# Patient Record
Sex: Male | Born: 1955 | Race: White | Hispanic: No | Marital: Married | Smoking: Former smoker
Health system: Southern US, Community
[De-identification: ages and names within clinical notes are randomized; demographics above are authoritative.]

## PROBLEM LIST (undated history)

## (undated) DIAGNOSIS — M79604 Pain in right leg: Secondary | ICD-10-CM

## (undated) DIAGNOSIS — M797 Fibromyalgia: Secondary | ICD-10-CM

## (undated) DIAGNOSIS — Z973 Presence of spectacles and contact lenses: Secondary | ICD-10-CM

## (undated) DIAGNOSIS — I10 Essential (primary) hypertension: Secondary | ICD-10-CM

## (undated) DIAGNOSIS — K219 Gastro-esophageal reflux disease without esophagitis: Secondary | ICD-10-CM

## (undated) DIAGNOSIS — F32A Depression, unspecified: Secondary | ICD-10-CM

## (undated) DIAGNOSIS — M79605 Pain in left leg: Secondary | ICD-10-CM

## (undated) DIAGNOSIS — Z8619 Personal history of other infectious and parasitic diseases: Secondary | ICD-10-CM

## (undated) DIAGNOSIS — Z87442 Personal history of urinary calculi: Secondary | ICD-10-CM

## (undated) DIAGNOSIS — F329 Major depressive disorder, single episode, unspecified: Secondary | ICD-10-CM

## (undated) DIAGNOSIS — G4733 Obstructive sleep apnea (adult) (pediatric): Secondary | ICD-10-CM

## (undated) DIAGNOSIS — E785 Hyperlipidemia, unspecified: Secondary | ICD-10-CM

## (undated) DIAGNOSIS — Z9989 Dependence on other enabling machines and devices: Secondary | ICD-10-CM

## (undated) DIAGNOSIS — N200 Calculus of kidney: Secondary | ICD-10-CM

## (undated) HISTORY — PX: OTHER SURGICAL HISTORY: SHX169

## (undated) HISTORY — PX: EXTRACORPOREAL SHOCK WAVE LITHOTRIPSY: SHX1557

---
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
## 1999-06-24 HISTORY — PX: CHOLECYSTECTOMY: SHX55

## 2000-02-17 ENCOUNTER — Ambulatory Visit (HOSPITAL_COMMUNITY): Admission: RE | Admit: 2000-02-17 | Discharge: 2000-02-17 | Payer: Self-pay | Admitting: Urology

## 2000-02-17 HISTORY — PX: OTHER SURGICAL HISTORY: SHX169

## 2001-11-14 ENCOUNTER — Inpatient Hospital Stay (HOSPITAL_COMMUNITY): Admission: EM | Admit: 2001-11-14 | Discharge: 2001-11-15 | Payer: Self-pay | Admitting: Physical Therapy

## 2001-11-17 ENCOUNTER — Encounter: Admission: RE | Admit: 2001-11-17 | Discharge: 2001-11-17 | Payer: Self-pay | Admitting: Family Medicine

## 2008-06-02 ENCOUNTER — Ambulatory Visit (HOSPITAL_COMMUNITY): Admission: RE | Admit: 2008-06-02 | Discharge: 2008-06-03 | Payer: Self-pay | Admitting: Urology

## 2008-06-02 HISTORY — PX: PERCUTANEOUS NEPHROSTOLITHOTOMY: SHX2207

## 2009-10-16 ENCOUNTER — Encounter: Admission: RE | Admit: 2009-10-16 | Discharge: 2009-10-16 | Payer: Self-pay | Admitting: Unknown Physician Specialty

## 2010-06-04 ENCOUNTER — Encounter
Admission: RE | Admit: 2010-06-04 | Discharge: 2010-06-04 | Payer: Self-pay | Source: Home / Self Care | Attending: Unknown Physician Specialty | Admitting: Unknown Physician Specialty

## 2010-11-05 NOTE — Op Note (Signed)
NAMEWILDON, CUEVAS                ACCOUNT NO.:  [id number]   MEDICAL RECORD NO.:  [id number]          PATIENT TYPE:  AMB   LOCATION:  DAY                          FACILITY:  [facility]   PHYSICIAN:  Bertram Millard. Dahlstedt, M.D.DATE OF BIRTH:  1955/09/02   DATE OF PROCEDURE:  06/02/2008  DATE OF DISCHARGE:                               OPERATIVE REPORT   ATTENDING:  Bertram Millard. Dahlstedt, M.D.   ASSISTANT:  Noemi Chapel   PREOPERATIVE DIAGNOSIS:  Right renal and ureteral calculi.   POSTOPERATIVE DIAGNOSIS:  Right renal and ureteral calculi.   INDICATIONS:  This is a 55 year old gentleman with a long-standing  history of bilateral stones.  He had a long history of right-sided renal  and ureteral calculus with associated hydroureteronephrosis and is here  today electively for management of the stones.  Risks and benefits as  well as well as the alternatives were discussed with the patient  preoperatively.   PROCEDURES:  1. Balloon dilation of percutaneously placed nephrostomy tract.  2. Antegrade pyelogram.  3. Percutaneous nephrolithotomy with laser stone manipulation and      basket stone extraction, total stone volume larger than 2 cm.   PROCEDURE IN DETAIL:  The patient was brought back to the operating room  and after the successful induction of general endotracheal anesthetic,  he received preoperative antibiotics, and after placing a Foley catheter  he was placed in the prone position.  A preoperative time-out was  performed and he received preoperative antibiotics and all pressure  points were padded appropriately.   He was prepped and draped in the usual sterile fashion and then we  accessed the nephrostomy tube that had been placed by the interventional  radiologist on the right side.  This was cannulated with a guidewire and  the guidewire tip was seen in the bladder fluoroscopically.  At this  point the nephrostomy tube was removed and over this a dilator sheath  was  placed to the level of the UPJ.  A second guidewire was placed into  the bladder as seen fluoroscopically and at this point the access sheath  was removed.  The balloon dilator was then inserted over a working wire  and the safety wire was secured.  After incising the skin to allow for  the opening of the balloon dilator, we opened the balloon and dilated to  18 atmospheres of pressure.  With adequate dilation in place, we placed  our access sheath through the skin into the collecting system.  We used  the rigid renoscope to evaluate tract position and it was adequate.  We  looked around the renal pelvis and identified some stones that were  grasped out.  Clot was removed too.   At this point the flexible cystoscope was used to navigate the patient's  pelvis.  We were able to identify more stones at the UPJ that were  basketed and brought out.  Further, we completed an antegrade pyelogram  that showed a dilated system and stones in the lower pole as well as  stones in the mid to distal ureter.  At this  point with the cystoscope  looking down the UPJ, a second working wire was placed, and over this a  flexible ureteroscope was advanced to do ureteroscopy.   The flexible ureteroscope was then advanced into the bladder.  We slowly  pulled it back toward the kidney, examining the ureter which appeared  normal until approximately at the pelvic brim at which point there were  2 large stone fragments.  These were grasped on access and brought  through the proximal ureter, UPJ, kidney, and brought out of the  patient.  We then continued examining the ureter which appeared normal  up to the UPJ and there were some stone fragments seen and they were  also removed.   We switched then back out to the flexible cystoscope, identified a stone  in the lower pole of the kidney, and lasered it into small pieces.  We  were then able to basket the stones with some difficulty.  An  approximately 5 to 6-mm  stone fragment was left in the lower pole calix  as it could not be accessed with the flexible cystoscope.  At this point  we reexamined the rest of the collecting system and identified no  further stones.  We placed a 20-French Foley catheter as a nephrostomy  tube and did another antegrade pyelogram to ensure adequate positioning  and to identify that there was no injury to the collecting system..  At  this point the tube was secured to the skin and the procedure was ended.  Please note Dr. Marcine Matar was the attending surgeon who was  present throughout the entirety of the case.   ESTIMATED BLOOD LOSS:  Minimal.   URINE OUTPUT:  Unrecorded.   DRAINS:  1. Foley catheter.  2. 20-French nephrostomy tube.   SPECIMENS:  Stone for analysis.   DISPOSITION:  The patient will go to the PACU for further care.     ______________________________  Corie Chiquito. Dahlstedt, M.D.  Electronically Signed    Hadley Pen  D:  06/02/2008  T:  06/03/2008  Job:  [id number]

## 2010-11-08 NOTE — Op Note (Signed)
[facility]  Patient:    Edward [name], Edward [name]                       MRN: [id number] Proc. Date: 02/17/00 Adm. Date:  [id number] Attending:  Liborio Nixon                           Operative Report  PREOPERATIVE DIAGNOSIS:  Right distal ureteral stone, large.  POSTOPERATIVE DIAGNOSIS:  Right distal ureteral stone, large.  PROCEDURE:  Ureteroscopy, basket extraction of right ureteral calculus.  SURGEON:  Dr. Retta Diones.  ANESTHESIA:  General.  COMPLICATIONS:  None.  BRIEF HISTORY:  A 55 year old male with a long history of recurrent urolithiasis. We had been watching a right distal ureteral stone for quite a few weeks. The patient has had significant intermittent pain with this and at this point desires stone extraction. He is aware of the risks and complications of ureteroscopic stone extraction and desires to proceed. We are prepared to do holmium laser lithotripsy if necessary as this is a fair size stone.  DESCRIPTION OF PROCEDURE:  The patient was administered a general anesthetic and placed in the dorsal lithotomy position. He had been administered IV antibiotics preoperatively. He was prepped and draped in the usual manner. A 6 French micro ureteroscope was advanced directly through his urethra and up into this right ureter where approximately 3-4 cm up a large stone was identified, grasped with the basket and extracted without much difficulty. The ureteroscope was then passed back through the ureter, midway up the ureter no further stones were seen. In his prehospital visit to the office, no other ureteral calculi were identified. At this point, the bladder was drained with a cystoscope, and the procedure terminated. The patient does not desire to have a stent in, stents actually bother him more than kidney stones. The patient tolerated the procedure well, was extubated and taken to the PACU in stable condition. DD:  02/17/00 TD:   02/17/00 Job: [id number]

## 2010-11-08 NOTE — Discharge Summary (Signed)
[place]  Patient:    Edward [name], Edward [name] Visit Number: [id number] MRN: [id number]          Service Type: MED Location: [phone] Attending Physician:  McDiarmid, Leighton Roach. Dictated by:   Harrold Donath, M.D. Admit Date:  11/13/2001 Discharge Date: 11/15/2001   CC:         Gevena Barre  [facility] [place], [place]   Discharge Summary  PROCEDURES PERFORMED: An echocardiogram was done on Nov 15, 2001, which showed an ejection fraction of 55% to 65% and no wall abnormalities.  DISCHARGE DIAGNOSES 1. Chest palpitations, possible supraventricular tachycardia. 2. Hypertension.  DISCHARGE MEDICATIONS 1. Hydrochlorothiazide 12.5 mg p.o. q.d. 2. Metoprolol 12.5 mg p.o. b.i.d. 3. Wellbutrin SR 150 mg p.o. b.i.d. 4. Nexium 40 mg p.o. b.i.d. 5. Percocet 1 to 2 p.o. q.4-6h. p.r.n. 6. Xanax 0.25 mg p.o. b.i.d. p.r.n. 7. The patient was told to stop his Cozaar.  HISTORY OF PRESENT ILLNESS: The patient is a 55 year old white male with a history of hypertension and hypercholesterolemia who presented with a "racing heart" over the last day. This occurred a week ago prior to admission and his primary care physician placed him on Xanax for anxiety. The patient states this did not help and the night of admission he began to have some epigastric and lower sternal chest pain which radiated to his right shoulder accompanied by diaphoresis and dyspnea. He was admitted for rule out myocardial infarction and workup.  HOSPITAL COURSE  1. Palpitations and atypical chest pain. The patient was admitted for rule out MI. He was started on aspirin and morphine and nitroglycerin p.r.n. Of note he did receive relief with one sublingual nitroglycerin. With his risk factors for cardiac etiology and echocardiogram was ordered to evaluate for any wall abnormality or decreased cardiac output. The echocardiogram results are as above. The patient ruled  out by enzymes. His first set was a CK of 117, MB 0.8 and troponin 0.03. The remainder of his enzymes were similar to that. His EKG showed a normal sinus rhythm.  The patient had no further episodes while here in the hospital. This chest pain could have been anxiety or gastroesophageal reflux disease or supraventricular tachycardia. A loop monitor needs to be placed on the patient, but the offices were closed since it was a holiday and this will be arranged the day after discharge.  2. Hypertension. The patients blood pressure was elevated at 153/89. He was only taking Cozaar as an outpatient. He was placed on hydrochlorothiazide the day following admission. The night prior to discharge his blood pressure elevated to diastolics of 120. The patient was placed on a beta blocker which brought his pressures down. He was discharged on the beta blocker and hydrochlorothiazide. If his blood pressures are elevated at his followup appointment, the Cozaar can be added on.  3. Possible obstructive sleep apnea. The patient is somewhat obese and snores at night. A sleep study as an outpatient may be considered.  CONDITION ON DISCHARGE: The patient was discharged to home in stable condition.  DISCHARGE INSTRUCTIONS: The patient was told of his new medical regimen. He was also told that [facility] will be contacting him the day after discharge for a loop monitor.  FOLLOW UP:  He was told to make an appointment with his primary care physician in one to two weeks for follow up.   Dictated by:   Harrold Donath, M.D.  Attending Physician:  McDiarmid,  Todd D. DD:  11/15/01 TD:  11/17/01 Job: [id number]

## 2010-11-08 NOTE — H&P (Signed)
[place]. [facility]  Patient:    Edward [name], Edward [name] Visit Number: [id number] MRN: [id number]          Service Type: MED Location: [phone] Attending Physician:  McDiarmid, Leighton Roach. Dictated by:   Harrold Donath, M.D. Admit Date:  11/13/2001   CC:         Myra Rude, M.D., [facility], [place], [place]  [phone]                         History and Physical  CHIEF COMPLAINT:  Palpitations.  HISTORY OF PRESENT ILLNESS:  This is a 55 year old white male with a history of hypertension and hypercholesterolemia, who presents with a "racing heart" over the last day.  This occurred a week ago and his primary care physician placed him on Xanax which has not helped.  The palpitations occurred at rest and were somewhat relieved with ambulation.  Percocet did not help; he receives Percocets from a rheumatologist for chronic lower extremity pain with no concrete diagnosis.  He found some relief with nitroglycerin x1 in the emergency department.  Tonight, he began to have epigastric and lower sternal chest pain which radiated to his right shoulder accompanied by diaphoresis and dyspnea.  REVIEW OF SYSTEMS:  Negative for fevers.  Positive for headache.  Otherwise negative and as above.  PAST MEDICAL HISTORY:  Gastroesophageal reflux disease, hypertension, hypercholesterolemia, chronic lower extremity pain.  He also had a stress test done in 1997 which was negative.  MEDICATIONS: 1. Nexium 40 mg b.i.d. 2. Cozaar 50 mg q.d. 3. Percocet q.4h. p.r.n. 4. Wellbutrin 150 mg SR b.i.d., but the patient takes it q.d. 5. Xanax 0.25 mg t.i.d. p.r.n.  ALLERGIES:  PENICILLIN gives him a rash.  SOCIAL HISTORY:  History of tobacco use for approximately 30 years.  No alcohol.  No street drugs.  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  FAMILY HISTORY:  His father has atrial fibrillation and a pacemaker.  His mother has  diabetes.   PHYSICAL EXAMINATION:  VITAL SIGNS:  Temperature 97.5, blood pressure 153/89, respirations 20, heart rate 86, saturating 98% on room air.  GENERAL:  He is alert but in no acute distress but he appears slightly anxious and stutters.  HEENT:  Pupils are equal, round and reactive to light.  Extraocular muscles are intact.  Oropharynx is clear.  NECK:  No lymphadenopathy.  No thyromegaly.  No JVD.  No bruits.  LUNGS:  Lungs were clear to auscultation bilaterally.  CARDIOVASCULAR:  Regular rate and rhythm.  No murmurs, rubs, or gallops. Dorsalis pedis pulses 2+ bilaterally.  ABDOMEN:  Positive bowel sounds.  Soft, nontender, nondistended.  No hepatosplenomegaly.  RECTAL:  Guaiac negative.  Normal tone.  No masses.  EXTREMITIES:  No clubbing, cyanosis, or edema.  LABORATORY AND ACCESSORY DATA:  White blood cell count 8.9, H&H 13.6 and 39.9, platelets 331,000; there were 69% PNMs and 21% lymphocytes.  Sodium 139, potassium 3.6, chloride 105, bicarb 31, BUN 11, creatinine 1, glucose 117, calcium 9.1, alkaline phosphatase 86, albumin 3.9; AST 27, ALT 25, bilirubin 0.7, total protein 6.9.  CK 117, MB 0.8, troponin 0.03, PT 12.7 with an INR of 0.9, PTT 30.  EKG showed normal sinus rhythm.  Chest x-ray was consistent with bibasilar atelectasis versus a possible slight pulmonary edema.  ASSESSMENT AND PLAN: 1. This is a 55 year old with palpitations and atypical chest pain.  The  etiology could be anxiety, gastroesophageal reflux disease, gastritis,    acute myocardial infarction.  Cardiac etiology is less likely with the    atypical presentation.  Will admit to telemetry for rule out, start    aspirin, morphine, nitroglycerin p.r.n. and oxygen.  The only concern with    this presentation is relief with nitroglycerin but gastroesophageal reflux    disease can also do this as well.  His risk factors include a history of    tobacco, his gender, hypercholesterolemia and  hypertension.  We will also    continue Nexium with the increased b.i.d. dose. 2. Chronic pain.  The patient was seen by a rheumatologist who was unsure of    his diagnosis and placed him on Percocet.  I feel the patient has an    increased potential of dependence and possible pain medication abuse.  We    will place on morphine and equal analgesic dose and continue his Xanax. 3. Hypertension.  Continue Cozaar.  Continue hydrochlorothiazide if his blood    pressure remains elevated. Dictated by:   Harrold Donath, M.D. Attending Physician:  McDiarmid, Tawanna Cooler D. DD:  11/14/01 TD:  11/15/01 Job: [id number]

## 2011-03-27 LAB — COMPREHENSIVE METABOLIC PANEL
AST: 20 U/L (ref 0–37)
Albumin: 3.9 g/dL (ref 3.5–5.2)
Alkaline Phosphatase: 80 U/L (ref 39–117)
Chloride: 104 mEq/L (ref 96–112)
GFR calc Af Amer: >60 mL/min (ref >60)
Potassium: 3.7 mEq/L (ref 3.5–5.1)
Total Bilirubin: 0.7 mg/dL (ref 0.3–1.2)

## 2011-03-27 LAB — BASIC METABOLIC PANEL
BUN: 10 mg/dL (ref 6–23)
Calcium: 8.7 mg/dL (ref 8.4–10.5)
GFR calc non Af Amer: 58 mL/min — ABNORMAL LOW (ref >60)
Glucose, Bld: 127 mg/dL — ABNORMAL HIGH (ref 70–99)

## 2011-03-27 LAB — CBC
HCT: 35.3 % — ABNORMAL LOW (ref 39.0–52.0)
Platelets: 226 10*3/uL (ref 150–400)
Platelets: 256 10*3/uL (ref 150–400)
RDW: 13.4 % (ref 11.5–15.5)
WBC: 6.1 10*3/uL (ref 4.0–10.5)

## 2011-05-20 ENCOUNTER — Other Ambulatory Visit (HOSPITAL_COMMUNITY): Payer: Self-pay | Admitting: Neurosurgery

## 2011-05-20 DIAGNOSIS — M542 Cervicalgia: Secondary | ICD-10-CM

## 2011-05-20 DIAGNOSIS — M545 Low back pain: Secondary | ICD-10-CM

## 2011-05-29 ENCOUNTER — Inpatient Hospital Stay (HOSPITAL_COMMUNITY): Admission: RE | Admit: 2011-05-29 | Payer: Self-pay | Source: Ambulatory Visit

## 2013-06-29 ENCOUNTER — Other Ambulatory Visit: Payer: Self-pay | Admitting: Urology

## 2013-07-15 ENCOUNTER — Encounter (HOSPITAL_COMMUNITY): Payer: Self-pay | Admitting: Pharmacy Technician

## 2013-07-18 ENCOUNTER — Encounter (INDEPENDENT_AMBULATORY_CARE_PROVIDER_SITE_OTHER): Payer: Self-pay

## 2013-07-18 ENCOUNTER — Encounter (HOSPITAL_COMMUNITY): Payer: Self-pay

## 2013-07-18 ENCOUNTER — Encounter (HOSPITAL_COMMUNITY)
Admission: RE | Admit: 2013-07-18 | Discharge: 2013-07-18 | Disposition: A | Discharge status: Home / Self Care | Payer: BC Managed Care – PPO | Source: Ambulatory Visit | Attending: Urology | Admitting: Urology

## 2013-07-18 DIAGNOSIS — Z01812 Encounter for preprocedural laboratory examination: Secondary | ICD-10-CM | POA: Insufficient documentation

## 2013-07-18 DIAGNOSIS — Z01810 Encounter for preprocedural cardiovascular examination: Secondary | ICD-10-CM | POA: Insufficient documentation

## 2013-07-18 DIAGNOSIS — Z01818 Encounter for other preprocedural examination: Secondary | ICD-10-CM | POA: Insufficient documentation

## 2013-07-18 HISTORY — DX: Pain in right leg: M79.604

## 2013-07-18 HISTORY — DX: Major depressive disorder, single episode, unspecified: F32.9

## 2013-07-18 HISTORY — DX: Essential (primary) hypertension: I10

## 2013-07-18 HISTORY — DX: Pain in right leg: M79.605

## 2013-07-18 HISTORY — DX: Fibromyalgia: M79.7

## 2013-07-18 HISTORY — DX: Gastro-esophageal reflux disease without esophagitis: K21.9

## 2013-07-18 HISTORY — DX: Hyperlipidemia, unspecified: E78.5

## 2013-07-18 HISTORY — DX: Personal history of urinary calculi: Z87.442

## 2013-07-18 HISTORY — DX: Depression, unspecified: F32.A

## 2013-07-18 LAB — BASIC METABOLIC PANEL
BUN: 10 mg/dL (ref 6–23)
CALCIUM: 9.6 mg/dL (ref 8.4–10.5)
CO2: 29 meq/L (ref 19–32)
Chloride: 99 mEq/L (ref 96–112)
Creatinine, Ser: 1.18 mg/dL (ref 0.50–1.35)
GFR calc Af Amer: 77 mL/min — ABNORMAL LOW (ref >90)
GFR, EST NON AFRICAN AMERICAN: 67 mL/min — AB (ref >90)
GLUCOSE: 121 mg/dL — AB (ref 70–99)
POTASSIUM: 3.8 meq/L (ref 3.7–5.3)
SODIUM: 138 meq/L (ref 137–147)

## 2013-07-18 LAB — CBC
HCT: 42.4 % (ref 39.0–52.0)
Hemoglobin: 14.2 g/dL (ref 13.0–17.0)
MCH: 30.2 pg (ref 26.0–34.0)
MCHC: 33.5 g/dL (ref 30.0–36.0)
MCV: 90.2 fL (ref 78.0–100.0)
PLATELETS: 243 10*3/uL (ref 150–400)
RBC: 4.7 MIL/uL (ref 4.22–5.81)
RDW: 14.5 % (ref 11.5–15.5)
WBC: 7.7 10*3/uL (ref 4.0–10.5)

## 2013-07-18 LAB — ABO/RH: ABO/RH(D): A POS

## 2013-07-18 NOTE — Patient Instructions (Addendum)
Marcie MowersKevan E Muscat  07/18/2013                           YOUR PROCEDURE IS SCHEDULED ON: 07/22/13               PLEASE REPORT TO SHORT STAY CENTER AT : 7:45 am               CALL THIS NUMBER IF ANY PROBLEMS THE DAY OF SURGERY :               [phone]                      REMEMBER:   Do not eat food or drink liquids AFTER MIDNIGHT   Take these medicines the morning of surgery with A SIP OF WATER: citalopram / prilosec / oxycodone   Do not wear jewelry, make-up   Do not wear lotions, powders, or perfumes.   Do not shave legs or underarms 12 hrs. before surgery (men may shave face)  Do not bring valuables to the hospital.  Contacts, dentures or bridgework may not be worn into surgery.  Leave suitcase in the car. After surgery it may be brought to your room.  For patients admitted to the hospital more than one night, checkout time is 11:00                          The day of discharge.   Patients discharged the day of surgery will not be allowed to drive home                             If going home same day of surgery, must have someone stay with you first                           24 hrs at home and arrange for some one to drive you home from hospital.    Special Instructions:   Please read over the following fact sheets that you were given:             1. [place] PREPARING FOR SURGERY SHEET             2. BRING C PAP MASK AND TUBING TO HOSPITAL                                                X_____________________________________________________________________        Failure to follow these instructions may result in cancellation of your surgery

## 2013-07-19 LAB — URINE CULTURE
Colony Count: NO GROWTH
Culture: NO GROWTH

## 2013-07-22 ENCOUNTER — Ambulatory Visit (HOSPITAL_COMMUNITY): Payer: BC Managed Care – PPO

## 2013-07-22 ENCOUNTER — Encounter (HOSPITAL_COMMUNITY): Payer: Self-pay | Admitting: *Deleted

## 2013-07-22 ENCOUNTER — Ambulatory Visit (HOSPITAL_COMMUNITY): Payer: BC Managed Care – PPO | Admitting: Certified Registered Nurse Anesthetist

## 2013-07-22 ENCOUNTER — Encounter (HOSPITAL_COMMUNITY): Payer: BC Managed Care – PPO | Admitting: Certified Registered Nurse Anesthetist

## 2013-07-22 ENCOUNTER — Ambulatory Visit (HOSPITAL_COMMUNITY)
Admission: RE | Admit: 2013-07-22 | Discharge: 2013-07-23 | Disposition: A | Discharge status: Home / Self Care | Payer: BC Managed Care – PPO | Source: Ambulatory Visit | Attending: Urology | Admitting: Urology

## 2013-07-22 ENCOUNTER — Encounter (HOSPITAL_COMMUNITY)
Admission: RE | Disposition: A | Discharge status: Home / Self Care | Payer: Self-pay | Source: Ambulatory Visit | Attending: Urology

## 2013-07-22 DIAGNOSIS — Z79899 Other long term (current) drug therapy: Secondary | ICD-10-CM | POA: Diagnosis not present

## 2013-07-22 DIAGNOSIS — N1330 Unspecified hydronephrosis: Secondary | ICD-10-CM | POA: Insufficient documentation

## 2013-07-22 DIAGNOSIS — I10 Essential (primary) hypertension: Secondary | ICD-10-CM | POA: Diagnosis not present

## 2013-07-22 DIAGNOSIS — Z87891 Personal history of nicotine dependence: Secondary | ICD-10-CM | POA: Diagnosis not present

## 2013-07-22 DIAGNOSIS — Z8619 Personal history of other infectious and parasitic diseases: Secondary | ICD-10-CM | POA: Diagnosis not present

## 2013-07-22 DIAGNOSIS — N201 Calculus of ureter: Secondary | ICD-10-CM | POA: Diagnosis present

## 2013-07-22 DIAGNOSIS — G4730 Sleep apnea, unspecified: Secondary | ICD-10-CM | POA: Insufficient documentation

## 2013-07-22 DIAGNOSIS — E7800 Pure hypercholesterolemia, unspecified: Secondary | ICD-10-CM | POA: Diagnosis not present

## 2013-07-22 DIAGNOSIS — J449 Chronic obstructive pulmonary disease, unspecified: Secondary | ICD-10-CM | POA: Diagnosis not present

## 2013-07-22 DIAGNOSIS — Z9089 Acquired absence of other organs: Secondary | ICD-10-CM | POA: Insufficient documentation

## 2013-07-22 DIAGNOSIS — IMO0001 Reserved for inherently not codable concepts without codable children: Secondary | ICD-10-CM | POA: Insufficient documentation

## 2013-07-22 DIAGNOSIS — J4489 Other specified chronic obstructive pulmonary disease: Secondary | ICD-10-CM | POA: Insufficient documentation

## 2013-07-22 DIAGNOSIS — N200 Calculus of kidney: Secondary | ICD-10-CM | POA: Diagnosis not present

## 2013-07-22 DIAGNOSIS — K219 Gastro-esophageal reflux disease without esophagitis: Secondary | ICD-10-CM | POA: Insufficient documentation

## 2013-07-22 DIAGNOSIS — R918 Other nonspecific abnormal finding of lung field: Secondary | ICD-10-CM | POA: Diagnosis not present

## 2013-07-22 DIAGNOSIS — M79609 Pain in unspecified limb: Secondary | ICD-10-CM | POA: Insufficient documentation

## 2013-07-22 HISTORY — PX: CYSTOSCOPY WITH RETROGRADE PYELOGRAM, URETEROSCOPY AND STENT PLACEMENT: SHX5789

## 2013-07-22 HISTORY — PX: HOLMIUM LASER APPLICATION: SHX5852

## 2013-07-22 HISTORY — PX: NEPHROLITHOTOMY: SHX5134

## 2013-07-22 LAB — TYPE AND SCREEN
ABO/RH(D): A POS
Antibody Screen: NEGATIVE

## 2013-07-22 LAB — CBC
HCT: 39.3 % (ref 39.0–52.0)
HEMOGLOBIN: 13.1 g/dL (ref 13.0–17.0)
MCH: 30.1 pg (ref 26.0–34.0)
MCHC: 33.3 g/dL (ref 30.0–36.0)
MCV: 90.3 fL (ref 78.0–100.0)
Platelets: 201 10*3/uL (ref 150–400)
RBC: 4.35 MIL/uL (ref 4.22–5.81)
RDW: 14.8 % (ref 11.5–15.5)
WBC: 10.1 10*3/uL (ref 4.0–10.5)

## 2013-07-22 LAB — BASIC METABOLIC PANEL
BUN: 15 mg/dL (ref 6–23)
CO2: 25 meq/L (ref 19–32)
Calcium: 8.9 mg/dL (ref 8.4–10.5)
Chloride: 99 mEq/L (ref 96–112)
Creatinine, Ser: 1.41 mg/dL — ABNORMAL HIGH (ref 0.50–1.35)
GFR calc Af Amer: 62 mL/min — ABNORMAL LOW (ref >90)
GFR calc non Af Amer: 54 mL/min — ABNORMAL LOW (ref >90)
Glucose, Bld: 167 mg/dL — ABNORMAL HIGH (ref 70–99)
Potassium: 4.6 mEq/L (ref 3.7–5.3)
SODIUM: 138 meq/L (ref 137–147)

## 2013-07-22 SURGERY — CYSTOURETEROSCOPY, WITH RETROGRADE PYELOGRAM AND STENT INSERTION
Anesthesia: General | Laterality: Right

## 2013-07-22 MED ORDER — BUPIVACAINE HCL 0.5 % IJ SOLN
INTRAMUSCULAR | Status: DC | PRN
  Administered 2013-07-22: 10 mL

## 2013-07-22 MED ORDER — LIDOCAINE HCL (CARDIAC) 20 MG/ML IV SOLN
INTRAVENOUS | Status: AC
  Filled 2013-07-22: qty 5

## 2013-07-22 MED ORDER — BUPIVACAINE HCL (PF) 0.5 % IJ SOLN
INTRAMUSCULAR | Status: AC
  Filled 2013-07-22: qty 30

## 2013-07-22 MED ORDER — CEFAZOLIN SODIUM-DEXTROSE 2-3 GM-% IV SOLR
INTRAVENOUS | Status: AC
  Filled 2013-07-22: qty 50

## 2013-07-22 MED ORDER — ROCURONIUM BROMIDE 100 MG/10ML IV SOLN
INTRAVENOUS | Status: AC
  Filled 2013-07-22: qty 1

## 2013-07-22 MED ORDER — ROCURONIUM BROMIDE 100 MG/10ML IV SOLN
INTRAVENOUS | Status: DC | PRN
  Administered 2013-07-22 (×2): 20 mg via INTRAVENOUS
  Administered 2013-07-22: 10 mg via INTRAVENOUS
  Administered 2013-07-22: 50 mg via INTRAVENOUS
  Administered 2013-07-22: 10 mg via INTRAVENOUS

## 2013-07-22 MED ORDER — NEOSTIGMINE METHYLSULFATE 1 MG/ML IJ SOLN
INTRAMUSCULAR | Status: AC
  Filled 2013-07-22: qty 10

## 2013-07-22 MED ORDER — LACTATED RINGERS IV SOLN
INTRAVENOUS | Status: DC
  Administered 2013-07-22 (×2): via INTRAVENOUS

## 2013-07-22 MED ORDER — PROPOFOL 10 MG/ML IV BOLUS
INTRAVENOUS | Status: AC
  Filled 2013-07-22: qty 20

## 2013-07-22 MED ORDER — NEOSTIGMINE METHYLSULFATE 1 MG/ML IJ SOLN
INTRAMUSCULAR | Status: DC | PRN
  Administered 2013-07-22: 5 mg via INTRAVENOUS

## 2013-07-22 MED ORDER — FENTANYL CITRATE 0.05 MG/ML IJ SOLN
INTRAMUSCULAR | Status: AC
  Filled 2013-07-22: qty 2

## 2013-07-22 MED ORDER — ONDANSETRON HCL 4 MG/2ML IJ SOLN
INTRAMUSCULAR | Status: DC | PRN
Start: 2013-07-22 — End: 2013-07-22
  Administered 2013-07-22: 4 mg via INTRAVENOUS

## 2013-07-22 MED ORDER — HYDROMORPHONE HCL PF 1 MG/ML IJ SOLN
INTRAMUSCULAR | Status: DC | PRN
  Administered 2013-07-22 (×2): 0.5 mg via INTRAVENOUS

## 2013-07-22 MED ORDER — LIDOCAINE-EPINEPHRINE (PF) 1 %-1:200000 IJ SOLN
INTRAMUSCULAR | Status: DC | PRN
  Administered 2013-07-22: 10 mL

## 2013-07-22 MED ORDER — PROPOFOL 10 MG/ML IV BOLUS
INTRAVENOUS | Status: DC | PRN
  Administered 2013-07-22: 200 mg via INTRAVENOUS

## 2013-07-22 MED ORDER — POLYETHYLENE GLYCOL 3350 17 GM/SCOOP PO POWD: 1.0000 | Freq: Every day | ORAL | Status: DC

## 2013-07-22 MED ORDER — MORPHINE SULFATE 4 MG/ML IJ SOLN
4.0000 mg | INTRAMUSCULAR | Status: DC | PRN
Start: 2013-07-22 — End: 2013-07-23

## 2013-07-22 MED ORDER — IOHEXOL 300 MG/ML  SOLN
INTRAMUSCULAR | Status: DC | PRN
  Administered 2013-07-22: 200 mL

## 2013-07-22 MED ORDER — GLYCOPYRROLATE 0.2 MG/ML IJ SOLN
INTRAMUSCULAR | Status: DC | PRN
  Administered 2013-07-22: .8 mg via INTRAVENOUS

## 2013-07-22 MED ORDER — LORATADINE 10 MG PO TABS
10.0000 mg | ORAL_TABLET | Freq: Every day | ORAL | Status: DC
  Administered 2013-07-23: 10 mg via ORAL
  Filled 2013-07-22: qty 1

## 2013-07-22 MED ORDER — MIDAZOLAM HCL 5 MG/5ML IJ SOLN
INTRAMUSCULAR | Status: DC | PRN
  Administered 2013-07-22: 2 mg via INTRAVENOUS

## 2013-07-22 MED ORDER — BACITRACIN-NEOMYCIN-POLYMYXIN 400-5-5000 EX OINT: 1.0000 "application " | TOPICAL_OINTMENT | Freq: Three times a day (TID) | CUTANEOUS | Status: DC | PRN

## 2013-07-22 MED ORDER — ZOLPIDEM TARTRATE 10 MG PO TABS
10.0000 mg | ORAL_TABLET | Freq: Every evening | ORAL | Status: DC | PRN
  Administered 2013-07-22: 10 mg via ORAL
  Filled 2013-07-22: qty 1

## 2013-07-22 MED ORDER — CITALOPRAM HYDROBROMIDE 40 MG PO TABS
40.0000 mg | ORAL_TABLET | Freq: Every morning | ORAL | Status: DC
  Administered 2013-07-23: 40 mg via ORAL
  Filled 2013-07-22: qty 1

## 2013-07-22 MED ORDER — LIDOCAINE-EPINEPHRINE 1 %-1:100000 IJ SOLN
INTRAMUSCULAR | Status: AC
  Filled 2013-07-22: qty 1

## 2013-07-22 MED ORDER — AMITRIPTYLINE HCL 50 MG PO TABS
50.0000 mg | ORAL_TABLET | Freq: Every day | ORAL | Status: DC
  Administered 2013-07-22: 75 mg via ORAL
  Filled 2013-07-22 (×2): qty 1.5

## 2013-07-22 MED ORDER — ONDANSETRON HCL 4 MG/2ML IJ SOLN
4.0000 mg | INTRAMUSCULAR | Status: DC | PRN
  Administered 2013-07-22: 4 mg via INTRAVENOUS
  Filled 2013-07-22: qty 2

## 2013-07-22 MED ORDER — PHENYLEPHRINE HCL 10 MG/ML IJ SOLN
10.0000 mg | INTRAVENOUS | Status: DC | PRN
  Administered 2013-07-22: 50 ug/min via INTRAVENOUS

## 2013-07-22 MED ORDER — CIPROFLOXACIN IN D5W 400 MG/200ML IV SOLN
400.0000 mg | INTRAVENOUS | Status: AC
  Administered 2013-07-22: 400 mg via INTRAVENOUS

## 2013-07-22 MED ORDER — BELLADONNA ALKALOIDS-OPIUM 16.2-60 MG RE SUPP: 1.0000 | Freq: Four times a day (QID) | RECTAL | Status: DC | PRN

## 2013-07-22 MED ORDER — MIDAZOLAM HCL 2 MG/2ML IJ SOLN
INTRAMUSCULAR | Status: AC
  Filled 2013-07-22: qty 2

## 2013-07-22 MED ORDER — DEXAMETHASONE SODIUM PHOSPHATE 10 MG/ML IJ SOLN
INTRAMUSCULAR | Status: DC | PRN
  Administered 2013-07-22: 10 mg via INTRAVENOUS

## 2013-07-22 MED ORDER — SODIUM CHLORIDE 0.45 % IV SOLN
INTRAVENOUS | Status: DC
  Administered 2013-07-22 – 2013-07-23 (×2): via INTRAVENOUS

## 2013-07-22 MED ORDER — SUCCINYLCHOLINE CHLORIDE 20 MG/ML IJ SOLN
INTRAMUSCULAR | Status: AC
  Filled 2013-07-22: qty 1

## 2013-07-22 MED ORDER — PROMETHAZINE HCL 25 MG/ML IJ SOLN: 6.2500 mg | INTRAMUSCULAR | Status: DC | PRN

## 2013-07-22 MED ORDER — MENTHOL 3 MG MT LOZG: 1.0000 | LOZENGE | OROMUCOSAL | Status: DC | PRN

## 2013-07-22 MED ORDER — ACETAMINOPHEN 10 MG/ML IV SOLN
1000.0000 mg | Freq: Four times a day (QID) | INTRAVENOUS | Status: DC
  Administered 2013-07-22 – 2013-07-23 (×2): 1000 mg via INTRAVENOUS
  Filled 2013-07-22 (×4): qty 100

## 2013-07-22 MED ORDER — LIDOCAINE HCL (CARDIAC) 20 MG/ML IV SOLN
INTRAVENOUS | Status: DC | PRN
  Administered 2013-07-22: 100 mg via INTRAVENOUS

## 2013-07-22 MED ORDER — CEFAZOLIN SODIUM-DEXTROSE 2-3 GM-% IV SOLR
2.0000 g | INTRAVENOUS | Status: AC
  Administered 2013-07-22: 2 g via INTRAVENOUS

## 2013-07-22 MED ORDER — PHENYLEPHRINE HCL 10 MG/ML IJ SOLN
INTRAMUSCULAR | Status: AC
  Filled 2013-07-22: qty 1

## 2013-07-22 MED ORDER — OXYCODONE HCL 5 MG PO TABS
15.0000 mg | ORAL_TABLET | ORAL | Status: DC | PRN
  Administered 2013-07-22 – 2013-07-23 (×5): 15 mg via ORAL
  Filled 2013-07-22 (×5): qty 3

## 2013-07-22 MED ORDER — CEFAZOLIN SODIUM-DEXTROSE 2-3 GM-% IV SOLR
2.0000 g | Freq: Once | INTRAVENOUS | Status: AC
  Administered 2013-07-22: 2 g via INTRAVENOUS

## 2013-07-22 MED ORDER — HYDROMORPHONE HCL PF 2 MG/ML IJ SOLN
INTRAMUSCULAR | Status: AC
  Filled 2013-07-22: qty 1

## 2013-07-22 MED ORDER — ONDANSETRON HCL 4 MG PO TABS: 4.0000 mg | ORAL_TABLET | Freq: Three times a day (TID) | ORAL | Status: DC | PRN

## 2013-07-22 MED ORDER — POLYETHYLENE GLYCOL 3350 17 G PO PACK
17.0000 g | PACK | Freq: Every day | ORAL | Status: DC
  Administered 2013-07-22: 17 g via ORAL
  Filled 2013-07-22 (×2): qty 1

## 2013-07-22 MED ORDER — HYDROMORPHONE HCL PF 1 MG/ML IJ SOLN: 0.2500 mg | INTRAMUSCULAR | Status: DC | PRN

## 2013-07-22 MED ORDER — DEXAMETHASONE SODIUM PHOSPHATE 10 MG/ML IJ SOLN
INTRAMUSCULAR | Status: AC
  Filled 2013-07-22: qty 1

## 2013-07-22 MED ORDER — CIPROFLOXACIN IN D5W 400 MG/200ML IV SOLN
INTRAVENOUS | Status: AC
  Filled 2013-07-22: qty 200

## 2013-07-22 MED ORDER — METOPROLOL SUCCINATE ER 50 MG PO TB24
50.0000 mg | ORAL_TABLET | Freq: Every day | ORAL | Status: DC
  Administered 2013-07-22: 50 mg via ORAL
  Filled 2013-07-22 (×2): qty 1

## 2013-07-22 MED ORDER — FENTANYL CITRATE 0.05 MG/ML IJ SOLN
INTRAMUSCULAR | Status: DC | PRN
  Administered 2013-07-22 (×5): 50 ug via INTRAVENOUS
  Administered 2013-07-22: 100 ug via INTRAVENOUS

## 2013-07-22 MED ORDER — SODIUM CHLORIDE 0.9 % IR SOLN
Status: DC | PRN
  Administered 2013-07-22: 20000 mL via INTRAVESICAL

## 2013-07-22 MED ORDER — SUCCINYLCHOLINE CHLORIDE 20 MG/ML IJ SOLN
INTRAMUSCULAR | Status: DC | PRN
  Administered 2013-07-22: 100 mg via INTRAVENOUS

## 2013-07-22 MED ORDER — FENTANYL CITRATE 0.05 MG/ML IJ SOLN
INTRAMUSCULAR | Status: AC
  Filled 2013-07-22: qty 5

## 2013-07-22 MED ORDER — BISACODYL 10 MG RE SUPP: 10.0000 mg | Freq: Every day | RECTAL | Status: DC | PRN

## 2013-07-22 MED ORDER — ONDANSETRON HCL 4 MG/2ML IJ SOLN
INTRAMUSCULAR | Status: AC
  Filled 2013-07-22: qty 2

## 2013-07-22 MED ORDER — SIMVASTATIN 40 MG PO TABS
40.0000 mg | ORAL_TABLET | Freq: Every day | ORAL | Status: DC
  Administered 2013-07-22: 40 mg via ORAL
  Filled 2013-07-22 (×2): qty 1

## 2013-07-22 MED ORDER — PANTOPRAZOLE SODIUM 40 MG PO TBEC
40.0000 mg | DELAYED_RELEASE_TABLET | Freq: Every day | ORAL | Status: DC
  Administered 2013-07-22 – 2013-07-23 (×2): 40 mg via ORAL
  Filled 2013-07-22 (×2): qty 1

## 2013-07-22 MED ORDER — GLYCOPYRROLATE 0.2 MG/ML IJ SOLN
INTRAMUSCULAR | Status: AC
  Filled 2013-07-22: qty 3

## 2013-07-22 MED ORDER — PHENOL 1.4 % MT LIQD: 1.0000 | OROMUCOSAL | Status: DC | PRN

## 2013-07-22 SURGICAL SUPPLY — 68 items
BAG URINE DRAINAGE (UROLOGICAL SUPPLIES) ×6 IMPLANT
BAG URO CATCHER STRL LF (DRAPE) ×3 IMPLANT
BASKET ZERO TIP NITINOL 2.4FR (BASKET) ×6 IMPLANT
BENZOIN TINCTURE PRP APPL 2/3 (GAUZE/BANDAGES/DRESSINGS) IMPLANT
BLADE SURG 15 STRL LF DISP TIS (BLADE) ×2 IMPLANT
BLADE SURG 15 STRL SS (BLADE) ×1
CATH AINSWORTH 30CC 24FR (CATHETERS) IMPLANT
CATH BEACON 5.038 65CM KMP-01 (CATHETERS) ×3 IMPLANT
CATH CLEAR GEL 3F BACKSTOP (CATHETERS) ×3 IMPLANT
CATH COUNCIL 22FR (CATHETERS) ×3 IMPLANT
CATH FOLEY 2W COUNCIL 20FR 5CC (CATHETERS) IMPLANT
CATH FOLEY 2WAY SLVR  5CC 16FR (CATHETERS) ×1
CATH FOLEY 2WAY SLVR 5CC 16FR (CATHETERS) ×2 IMPLANT
CATH INTERMIT  6FR 70CM (CATHETERS) ×3 IMPLANT
CATH KUMPE (CATHETERS) ×1
CATH ROBINSON RED A/P 20FR (CATHETERS) IMPLANT
CATH SLIP 5FR 0.38 X 40 KMP (CATHETERS) ×2 IMPLANT
CATH URET 5FR 28IN CONE TIP (BALLOONS)
CATH URET 5FR 28IN OPEN ENDED (CATHETERS) ×6 IMPLANT
CATH URET 5FR 70CM CONE TIP (BALLOONS) IMPLANT
CATH URET DUAL LUMEN 6-10FR 50 (CATHETERS) ×3 IMPLANT
CATH X-FORCE N30 NEPHROSTOMY (TUBING) ×3 IMPLANT
CHLORAPREP W/TINT 26ML (MISCELLANEOUS) ×6 IMPLANT
CLOTH BEACON ORANGE TIMEOUT ST (SAFETY) ×3 IMPLANT
COVER SURGICAL LIGHT HANDLE (MISCELLANEOUS) ×3 IMPLANT
DERMABOND ADVANCED (GAUZE/BANDAGES/DRESSINGS) ×1
DERMABOND ADVANCED .7 DNX12 (GAUZE/BANDAGES/DRESSINGS) ×2 IMPLANT
DRAPE C-ARM 42X120 X-RAY (DRAPES) ×6 IMPLANT
DRAPE CAMERA CLOSED 9X96 (DRAPES) ×6 IMPLANT
DRAPE LG THREE QUARTER DISP (DRAPES) ×9 IMPLANT
DRAPE LINGEMAN PERC (DRAPES) IMPLANT
DRAPE SURG IRRIG POUCH 19X23 (DRAPES) ×3 IMPLANT
DRSG TEGADERM 2-3/8X2-3/4 SM (GAUZE/BANDAGES/DRESSINGS) ×6 IMPLANT
DRSG TEGADERM 8X12 (GAUZE/BANDAGES/DRESSINGS) ×3 IMPLANT
FIBER LASER FLEXIVA 365 (UROLOGICAL SUPPLIES) ×6 IMPLANT
FIBER LASER FLEXIVA 550 (UROLOGICAL SUPPLIES) IMPLANT
GLOVE BIOGEL M STRL SZ7.5 (GLOVE) ×9 IMPLANT
GOWN STRL REUS W/TWL XL LVL3 (GOWN DISPOSABLE) ×9 IMPLANT
GUIDEWIRE AMPLAZ .035X145 (WIRE) ×6 IMPLANT
GUIDEWIRE ANG ZIPWIRE 038X150 (WIRE) ×6 IMPLANT
GUIDEWIRE STR DUAL SENSOR (WIRE) ×12 IMPLANT
KIT BASIN OR (CUSTOM PROCEDURE TRAY) ×3 IMPLANT
LABEL STERILE EO BLANK 1X3 WHT (LABEL) ×3 IMPLANT
MANIFOLD NEPTUNE II (INSTRUMENTS) ×3 IMPLANT
NEEDLE HYPO 22GX1.5 SAFETY (NEEDLE) ×3 IMPLANT
NEEDLE TROCAR 18X15 ECHO (NEEDLE) ×3 IMPLANT
NEEDLE TROCAR 18X20 (NEEDLE) ×3 IMPLANT
NS IRRIG 1000ML POUR BTL (IV SOLUTION) IMPLANT
PACK BASIC VI WITH GOWN DISP (CUSTOM PROCEDURE TRAY) ×3 IMPLANT
PACK CYSTO (CUSTOM PROCEDURE TRAY) ×3 IMPLANT
PAD ABD 7.5X8 STRL (GAUZE/BANDAGES/DRESSINGS) IMPLANT
PROBE LITHOCLAST ULTRA 3.8X403 (UROLOGICAL SUPPLIES) IMPLANT
PROBE PNEUMATIC 1.0MMX570MM (UROLOGICAL SUPPLIES) IMPLANT
SET IRRIG Y TYPE TUR BLADDER L (SET/KITS/TRAYS/PACK) ×3 IMPLANT
SET WARMING FLUID IRRIGATION (MISCELLANEOUS) IMPLANT
SPONGE GAUZE 4X4 12PLY (GAUZE/BANDAGES/DRESSINGS) ×3 IMPLANT
SPONGE LAP 4X18 X RAY DECT (DISPOSABLE) ×6 IMPLANT
STENT CONTOUR 6FRX26X.038 (STENTS) ×6 IMPLANT
STONE CATCHER W/TUBE ADAPTER (UROLOGICAL SUPPLIES) ×6 IMPLANT
SUT SILK 0 FSL (SUTURE) ×3 IMPLANT
SYR 20CC LL (SYRINGE) ×6 IMPLANT
SYR CONTROL 10ML LL (SYRINGE) ×3 IMPLANT
SYRINGE 10CC LL (SYRINGE) ×6 IMPLANT
TOWEL OR 17X26 10 PK STRL BLUE (TOWEL DISPOSABLE) ×3 IMPLANT
TUBE FEEDING 8FR 16IN STR KANG (MISCELLANEOUS) ×3 IMPLANT
TUBING CONNECTING 10 (TUBING) ×9 IMPLANT
WATER STERILE IRR 1500ML POUR (IV SOLUTION) IMPLANT
WIRE COONS/BENSON .038X145CM (WIRE) IMPLANT

## 2013-07-22 NOTE — Progress Notes (Signed)
At pharmacy request verified with doctor patient to get both Cipro and Ancef [facility]-op info [facility]

## 2013-07-22 NOTE — Transfer of Care (Signed)
Immediate Anesthesia Transfer of Care Note  Patient: Marcie MowersKevan E Mitcham  Procedure(s) Performed: Procedure(s) (LRB): CYSTOSCOPY WITH LEFT AND RIGHT  URETEROSCOPY ,HOLMIUM LASER LITHTRIPSY,  STENT PLACEMENT, RIGHT AND LEFT  RETROGRAM PYELOGRAM  (Bilateral) RIGHT PERCUTANEOUS NEPHROLITHOTOMY SURGEON ACCESS  (Right) HOLMIUM LASER APPLICATION (Bilateral)  Patient Location: PACU  Anesthesia Type: General  Level of Consciousness: sedated, patient cooperative and responds to stimulation  Airway & Oxygen Therapy: Patient Spontanous Breathing and Patient connected to face mask oxgen  Post-op Assessment: Report given to PACU RN and Post -op Vital signs reviewed and stable  Post vital signs: Reviewed and stable  Complications: No apparent anesthesia complications

## 2013-07-22 NOTE — Preoperative (Signed)
Beta Blockers   Reason not to administer Beta Blockers:Not Applicable Patient took Beta Blocker 07-21-13 at 1700

## 2013-07-22 NOTE — Anesthesia Preprocedure Evaluation (Addendum)
Anesthesia Evaluation  Patient identified by MRN, date of birth, ID band Patient awake    Reviewed: Allergy & Precautions, H&P , NPO status , Patient's Chart, lab work & pertinent test results  Airway Mallampati: II TM Distance: <3 FB Neck ROM: Full    Dental no notable dental hx.    Pulmonary sleep apnea and Continuous Positive Airway Pressure Ventilation , COPDformer smoker,  breath sounds clear to auscultation  Pulmonary exam normal       Cardiovascular hypertension, Pt. on medications and Pt. on home beta blockers Rhythm:Regular Rate:Normal     Neuro/Psych negative neurological ROS  negative psych ROS   GI/Hepatic Neg liver ROS, GERD-  Medicated,  Endo/Other  negative endocrine ROS  Renal/GU negative Renal ROS  negative genitourinary   Musculoskeletal negative musculoskeletal ROS (+)   Abdominal   Peds negative pediatric ROS (+)  Hematology negative hematology ROS (+)   Anesthesia Other Findings   Reproductive/Obstetrics negative OB ROS                        Anesthesia Physical Anesthesia Plan  ASA: III  Anesthesia Plan: General   Post-op Pain Management:    Induction: Intravenous  Airway Management Planned: Oral ETT  Additional Equipment:   Intra-op Plan:   Post-operative Plan: Extubation in OR  Informed Consent: I have reviewed the patients History and Physical, chart, labs and discussed the procedure including the risks, benefits and alternatives for the proposed anesthesia with the patient or authorized representative who has indicated his/her understanding and acceptance.   Dental advisory given  Plan Discussed with: CRNA and Surgeon  Anesthesia Plan Comments:         Anesthesia Quick Evaluation

## 2013-07-22 NOTE — Op Note (Addendum)
Pre-operative diagnosis: Bilateral obstructing ureteral stones and bilateral renal stones. Post-operative diagnosis: as above   Procedure performed: cystoscopy, left retrograde pyelogram with interpretation, left ureteroscopy/laser lithotripsy stone removal, left ureteral stent placement, right retrograde pyelogram with interpretation, attempted right percutaneous renal access, right nephrolithotomy, right antegrade ureteroscopy/laser lithotripsy stone removal, right nephrostogram, right ureteral stent placement, right nephrostomy tube placement.   Surgeon: Dr. Ardis Hughs  Assistant: Dr. Owens Shark, M.D., interventional radiologist  Anesthesia: General  Complications: None  Specimens: The majority of the stones were removed and will be sent to the [facility] for further analysis.  Findings: 1. The left ureter was obstructed at the distal aspect just proximal to the UVJ. A retrograde pyelogram confirmed multiple small ureteral stones in the distal ureter. The proximal ureter was dilated. There was not enough contrast to fill the left collecting system and thus no filling defects were noted in the left renal pelvis. A 26 cm x 6 French double-J ureteral stent was placed in the left side. 2. The right ureter was obstructed at the mid ureteral aspect with significant hydronephrosis/hydroureter. A retrograde pyelogram was performed and showed a large filling defect noted in the mid ureter. The proximal ureter was tortuous and the renal pelvis severely dilated. 3.  I attempted multiple accesses in the Right renal pelvis in the lower pole posterior calyx unsuccessfully. I also attempted and access in the interpolar calyx which I was unable to obtain safely. After numerous failed attempts, I called Dr. Ronny Bacon from interventional radiology to assist with obtaining percutaneous renal access. Please see his dictation for further details. Ultimately access was obtained in the lower pole,  possibly between calyces in the renal pelvis. 4. There was a significant amount of bleeding once the sheath was removed, as such a 2 [place] council tip catheter was left in the tract to help tamponade the bleeding. 5. Given the significant stone burden on the patient's left side, this will most likely require a staged or repeat ureteroscopy.  EBL: Approximately 250 cc  Specimens: stone from collecting system - taken to [facility]  Indication: Edward [name] is a 58 y.o. patient with a history of stones who presented to clinic with bilateral obstructing ureteral stones, and a large stone burden in each renal pelvis. After reviewing the management options for treatment, he elected to proceed with the above surgical procedure(s). We have discussed the potential benefits and risks of the procedure, side effects of the proposed treatment, the likelihood of the patient achieving the goals of the procedure, and any potential problems that might occur during the procedure or recuperation. Informed consent has been obtained.   Description:  Consent was obtained in the preoperative holding area. The patient was marked appropriately and then taken back to the operating room and placed on the table in supine position. No anesthesia was then induced and endotracheal tube inserted. He was then placed in dorsal lithotomy position and prepped and draped in the routine sterile fashion. Timeout was then held.  Using a 73 French 30 cystoscope I gently went through the patient's urethra and into the bladder under visual guidance. A 360 cystoscopic evaluation was performed with no tumors, stones, or abnormalities of the mucosa where identified. The ureters were in orthotopic position. Then advanced a 0.38 French sensor wire into the left renal pelvis and passed a 5 [place] open-ended ureteral catheter over the wire and into the left ureter. A retrograde pyelogram was then performed with the above  findings. I then reinserted the wire through the open-ended ureteral catheter and placed into the renal pelvis. Remove the [facility] catheter over the wire. I then drained the bladder and inserted a 4/6 French semirigid ureteroscope into the patient's urethra and bladder under visual guidance. I was in able to gently guided into the left ureter. I encountered the patient's stones in the distal ureter just proximal to the UVJ. I then decided to use the backstop from [place] to help prevent any retropulsion of the stone fragments. I inserted the backstop catheter beyond the stones and injected the full mount into the ureter. I then used a 360  fiber with settings of 0.6 J x6 Hz and fragmented the stones into the smaller pieces that could be easily grasped using the 0 tip Nitinol basket. Once all the stones had been removed from the ureter the ureteroscope was then removed and the patient. Given the significant amount of stone burden in the patient's left renal pelvis, we decided to perform a staged ureteroscopy, and plan to return in 2 weeks for repeat left-sided ureteroscopy. I then [facility] catheter back over the wire and removed the wire and performed a retrograde pyelogram which did not reveal any other filling defects within the ureter. This point I then exchanged the 0.38 sensor wire with the open-ended ureteral catheter and a 6 French by 26 cm double-J ureteral stent was passed over the wire and placed into the left renal pelvis using fluoroscopic guidance. A nice curl was noted in the renal pelvis and the wire was removed.  We then turned our attention to the patient's right ureteral orifice. I performed a retrograde pyelogram using a 5 French open-ended ureteral Sallyanne Havers noting a large filling defects in the mid ureter with proximal hydronephrosis. I then advanced a wire through the [facility] catheter and up into the renal pelvis. I then tried the 4/6 semirigid ureteroscope into the patient's right  ureter and up to the mid ureteral region. I was unable to encounter the stone, I could not get this scope over the pelvic brim. This point we decided to leave a Pollock catheter and foot the patient prone and perform a PCNL.  The patient was flipped prone onto the split leg OR table. Large jelly rolls were placed in the anterior axillary line on both sides allowing the patient's chest and abdomen to fall inbetween. The patient was then prepped and draped in the routine sterile fashion in the right flank. A second timeout was then held confirming the proper side and procedure as well as antibiotics were administered.  Using the C-arm rotated at 25 and the bulls-eye technique with an 18-gauge coaxial needle the lower pole posterior lateral calyx was targeted. Then rotating the C-arm AP depth of our needle was noted to be within the calyx and the inner part of the coaxial needle was removed. Urine was noted to return. However, I was unable to get the wire and advanced into the collecting system. Multiple attempts were tried passing the coaxial needle several times in different locations to access the lower pole calyx. I also tried to access the interpolar calyx just below the level and rhythm, this was unsuccessful as well. After numerous failed attempts, I consult to Dr. Ulice Dash walked, M.D. from interventional radiology to help obtain renal access. Please see his note for further details. Ultimately he was able to obtain access in the lower pole. The case was then turned back over to me once a Super Stiff wire  had been passed percutaneously from the kidney into the bladder.  A 9 French peel-away dilator was then advanced over the Super Stiff wire and passed into the renal pelvis and across the UPJ under fluoroscopic guidance. The inner part of this dilator was removed and the 0.38 sensor wire was passed alongside the Super Stiff wire through the dilator and into the right ureter and down into the bladder. The  angiographic catheter again was passed over the guidewire and advanced into the bladder, the wire was then removed. A Super Stiff wire was then passed through angiographic catheter and angiographic catheter removed. The outer part of the sheath was then removed, establishing 2 superstiff wires through the lower pole calyx and into the bladder.   The 18 French NephroMax balloon was then passed over one of the Super Stiff wires and the tip guided down into the right upper pole calyx. The balloon was then inflated to approximately 12 atm, and once there was no waist noted under fluoroscopy the access sheath was advanced over the balloon. The balloon was then removed. The wires were then placed back into the sheaths and snapped to the drape.   Using the rigid nephroscope to explore the renal pelvis I encountered 2 large calculi in the lower pole calyx which I was able to remove without fragmentation using the 2 prong grasper. Then using a flexible cystoscope to navigate the remaining calyces of the kidney no additional stone fragments were encountered within the kidney. I then used the flexible cystoscope to navigate down the patient's proximal ureter. We quickly encountered a very large stone in the mid ureter. Using the 365  laser fiber with settings of 10 Hz x1 J the stone was fragmented into numerous smaller stone fragments which were then removed using the 0 tip basket. The lithoclast was then used to take care of any of the additional fragments floating around in the renal pelvis.  A dual-lumen catheter was then used to put a 0.38 sensor wire into the ureter and into the bladder. The sensor wire was then backloaded over the rigid nephroscope using the stent pusher and a 26cm x 6 French double-J ureteral stent was passed antegrade over the sensor wire down into the bladder under fluoroscopic guidance. Once the stent was in the bladder the wire was gently pulled back and a nice curl noted in the bladder. The  wire completely removed from the stent, and nice curl on the proximal end of the stent was noted in the renal pelvis. The sheath was then backed out slowly to ensure that all calyces had been inspected and there was nothing behind the sheath.   A 22 [place] council tip catheter was then passed over one of the Super Stiff wires through the sheath and into the renal pelvis. The sheath was then backed out of the kidney and cut off the red rubber catheter. A nephrostogram was then performed confirming the position of our nephrostomy tube and reassuring that there were no longer any filling defects from the patient's symptoms. After several minutes of direct pressure and observation there was still some hematuria coming from the nephrostomy tube tract . As such, I DID not remove the nephrostomy tube , and secured it with a 0 silk suture. 25 cc of local anesthesia was then injected into the patient's wound. The incision was then padded using a bundle of 4 x 4's and Hypafix tape. Patient was subsequently rolled over to the supine position and extubated. He was  returned to the PACU in excellent condition. At the end of the case all lap and needle and sponges were accounted for. There are no perioperative complications.   Disposition:The patient was transferred to the PACU in stable condition.  He'll be admitted for Observation andextended Postoperative recovery. The plan is to repeat the patient's left ureteroscopy in 2 weeks.

## 2013-07-22 NOTE — H&P (Signed)
Reason For Visit Kidney stones   History of Present Illness A 58 year old gentleman previously seen by Dr. Retta Diones 2009, Dr. Aldean Ast before that, and Dr. Teofilo Pod at [facility] along the way for kidney stones. He is most recently status post right PCN in 12/09. He passes stones all the time he says. He lives in pain. However, his pain is mostly his back and legs, he treated for fibromyalgia and takes chronic Percocet. One of his office visits with Dr. Retta Diones he brought a jar of 20 stones, one of them measuring 12 mm x 8 mm. He has not had any intervention or follow-up since December 2009. He lives with his wife in a motor home, they are constantly traveling back and forth from the [place] to the [place]. The patient was rereferred to urology after undergoing a CT of his chest for some lung nodules which also captured the top part of the urinary tract. He was noted to have right-sided hydronephrosis as well as bilateral kidney stones. This CAT scan was done at [facility] chest and is not available for my review. The patient reports that he does have some right-sided flank and bladder pain as well as some left sided pain. However, he tells me he is not in a significant amount of pain in his kidneys, and that his pain is mostly in his legs. The patient denies any fevers, chills, hematuria or dysuria.  The patient has recently had an evaluation for difficulty breathing. He was started on CPAP at night for his sleep apnea, and also recently had PFTs which she states were normal. He has not had an echocardiogram.   Past Medical History Problems  1. History of esophageal reflux (V12.79) 2. History of hepatitis (V12.09) 3. History of hypercholesterolemia (V12.29) 4. History of hypertension (V12.59)  Surgical History Problems  1. History of Appendectomy 2. History of Cholecystectomy 3. History of Kidney Surgery 4. History of Lithotripsy 5. History of Percutaneous Lithotomy For Stone Over 2cm.  Current  Meds 1. Amitriptyline HCl - 25 MG Oral Tablet;  Therapy: (Recorded:06Jan2015) to Recorded 2. Citalopram Hydrobromide 40 MG Oral Tablet;  Therapy: (Recorded:06Jan2015) to Recorded 3. Cyanocobalamin 2500 MCG Sublingual Tablet Sublingual;  Therapy: (Recorded:06Jan2015) to Recorded 4. Endocet 10-650 MG TABS; TAKE 1 TABLET 4 times daily;  Therapy: (Recorded:19Oct2009) to Recorded 5. Hydrochlorothiazide TABS; 12.5 mg;  Therapy: (Recorded:19Oct2009) to Recorded 6. Melatonin 3 MG Oral Capsule;  Therapy: (Recorded:19Oct2009) to Recorded 7. Meloxicam 15 MG Oral Tablet;  Therapy: (Recorded:19Oct2009) to Recorded 8. Metoprolol Succinate ER 50 MG Oral Tablet Extended Release 24 Hour;  Therapy: (Recorded:06Jan2015) to Recorded 9. Omeprazole 20 MG Oral Capsule Delayed Release;  Therapy: (Recorded:19Oct2009) to Recorded 10. Simvastatin 40 MG Oral Tablet;   Therapy: (Recorded:06Jan2015) to Recorded 11. Sulfamethoxazole-TMP DS 800-160 MG Oral Tablet; TAKE 1 TABLET TWICE DAILY;   Therapy: 17Dec2009 to (Evaluate:27Dec2009); Last Rx:17Dec2009 Ordered 12. Testosterone Cypionate 200 MG/ML OIL;   Therapy: (Recorded:06Jan2015) to Recorded 13. Zyrtec 10 MG TABS;   Therapy: (Recorded:19Oct2009) to Recorded  Allergies Medication  1. Penicillins  Family History Problems  1. Family history of Nephrolithiasis : Father 2. Family history of Nephrolithiasis : Mother  Social History Problems    Denied: History of Alcohol Use   Caffeine Use   caffeine free as much as possible   Marital History - Currently Married   Occupation:   [employer]   Tobacco Use (V15.82)   Smoked for 30-35 yrs and quit 9 yrs ago  Review of Systems Genitourinary, constitutional, skin, eye, otolaryngeal,  hematologic/lymphatic, cardiovascular, pulmonary, endocrine, musculoskeletal, gastrointestinal, neurological and psychiatric system(s) were reviewed and pertinent findings if present are noted.  Genitourinary: urinary  frequency, feelings of urinary urgency, nocturia, hematuria and erectile dysfunction.  Gastrointestinal: heartburn, diarrhea and constipation.  Constitutional: feeling tired (fatigue).  Integumentary: pruritus.  ENT: sinus problems.  Respiratory: shortness of breath.  Musculoskeletal: back pain and joint pain.    Vitals Vital Signs [Data Includes: Last 1 Day]  Recorded: 06Jan2015 03:39PM  Height: 6 ft 1 in Weight: 270 lb  BMI Calculated: 35.62 BSA Calculated: 2.44 Recorded: 06Jan2015 03:21PM  Blood Pressure: 130 / 79 Temperature: 98.3 F Heart Rate: 89  Physical Exam Constitutional: Well nourished and well developed . No acute distress.  ENT:. The ears and nose are normal in appearance.  Neck: The appearance of the neck is normal and no neck mass is present.  Pulmonary: No respiratory distress and normal respiratory rhythm and effort.  Cardiovascular: Heart rate and rhythm are normal . No peripheral edema.  Abdomen: The abdomen is soft and nontender. No masses are palpated. No CVA tenderness. No hernias are palpable. No hepatosplenomegaly noted.  Skin: Normal skin turgor, no visible rash and no visible skin lesions.  Neuro/Psych:. Mood and affect are appropriate.    Results/Data Urine [Data Includes: Last 1 Day]   06Jan2015  COLOR YELLOW   APPEARANCE CLEAR   SPECIFIC GRAVITY 1.010   pH 6.0   GLUCOSE NEG mg/dL  BILIRUBIN NEG   KETONE NEG mg/dL  BLOOD LARGE   PROTEIN NEG mg/dL  UROBILINOGEN 0.2 mg/dL  NITRITE NEG   LEUKOCYTE ESTERASE MOD   SQUAMOUS EPITHELIAL/HPF RARE   WBC 7-10 WBC/hpf  RBC 11-20 RBC/hpf  BACTERIA RARE   CRYSTALS NONE SEEN   CASTS NONE SEEN   Selected Results  UA With REFLEX 06Jan2015 02:50PM Berniece SalinesHerrick, Melissia Lahman  SPECIMEN TYPE: CLEAN CATCH   Test Name Result Flag Reference  COLOR YELLOW  YELLOW  APPEARANCE CLEAR  CLEAR  SPECIFIC GRAVITY 1.010  1.005-1.030  pH 6.0  5.0-8.0  GLUCOSE NEG mg/dL  NEG  BILIRUBIN NEG  NEG  KETONE NEG mg/dL  NEG   BLOOD LARGE A NEG  PROTEIN NEG mg/dL  NEG  UROBILINOGEN 0.2 mg/dL  0.9-8.10.0-1.0  NITRITE NEG  NEG  LEUKOCYTE ESTERASE MOD A NEG  SQUAMOUS EPITHELIAL/HPF RARE  RARE  WBC 7-10 WBC/hpf A <3  RBC 11-20 RBC/hpf A <3  BACTERIA RARE  RARE  CRYSTALS NONE SEEN  NONE SEEN  CASTS NONE SEEN  NONE SEEN   AU CT-STONE PROTOCOL 06Jan2015 12:00AM Berniece SalinesHerrick, Zoee Heeney   Test Name Result Flag Reference  CT-STONE PROTOCOL (Report)    ** RADIOLOGY REPORT BY [facility] **   CLINICAL DATA: Microscopic hematuria and right-sided back pain. Ex-smoker. History of renal stones and prior lithotripsy.  EXAM: CT ABDOMEN AND PELVIS WITHOUT CONTRAST (URINARY CALCULUS PROTOCOL)  TECHNIQUE: Multidetector CT imaging was performed through the abdomen and pelvis without intravenous contrast to include the urinary tract.  COMPARISON: DG ABDOMEN 1V dated 06/08/2008; US RENAL dated 04/13/2008  FINDINGS: Lower Chest: Suspicion of a 4 mm right lower lobe lung nodule on image 4 (along a branching vessel). Left lower lobe subpleural nodules x2 on the order of 2 mm. Normal heart size without pericardial or pleural effusion.  Abdomen/Pelvis: Incompletely imaged liver demonstrates caudate lobe prominence and suggestion of atrophy of the medial segment left lobe. Normal spleen, stomach. Fatty atrophy throughout the pancreas. Cholecystectomy without biliary ductal dilatation. Normal adrenal glands.  Bilateral mild renal  atrophy. Bilateral renal collecting system stones. No significant left-sided hydronephrosis. A proximal left ureteric stone measures 8 mm on transverse image 43 and 12 mm on coronal image 105. A distal left ureteric stone or conglomeration of stones measure 9 mm on transverse image 75 and 2.2 cm craniocaudal on coronal image 119.  Mild to moderate right-sided hydroureteronephrosis to the level of a mid right ureteric stone which measures 1.0 cm on image 56/ series 2 transverse and 1.7 cm on  coronal image 96.  Borderline enlarged precarinal node at 1.0 cm on image 40. Periportal node measures 1.5 cm on image 20.  Normal colon and terminal ileum. Normal small bowel without abdominal ascites. No pelvic adenopathy. Normal urinary bladder and prostate. No significant free fluid.  Bones/Musculoskeletal: No acute osseous abnormality.  IMPRESSION: 1. Mild to moderate right-sided hydroureteronephrosis secondary to a mid right ureteric 1.7 cm stone. 2. Multiple left-sided ureteric stones, without significant proximal obstruction. 3. Bilateral nephrolithiasis. 4. Mild upper abdominal adenopathy. Concurrent hepatic findings for which mild cirrhosis is suspected. Correlate with risk factors of liver disease. 5. Lung base nodules. If the patient is at high risk for bronchogenic carcinoma, follow-up chest CT at 1 year is recommended. If the patient is at low risk, no follow-up is needed. This recommendation follows the consensus statement: "Guidelines for Management of Small Pulmonary Nodules Detected on CT Scans: A Statement from the Fleischner Society" as published in Radiology 2005; 237:395-400. Available online at: DietDisorder.cz.   Electronically Signed  By: Jeronimo Greaves M.D.  On: 06/28/2013 17:14   Assessment The patient has bilateral ureteral calculus, the right ureter has an 8 mm x 15 mm obstructing stone with proximal hydronephrosis. There is no hydrocele on the left side. In addition, the patient appears to have thinning the renal parenchyma bilaterally   Plan Calculus of ureter  1. Follow-up Schedule Surgery Office  Follow-up  Status: Complete  Done: 06Jan2015 2. AU CT-STONE PROTOCOL; Status:Complete;   Done: 06Jan2015 12:00AM 3. URINE CULTURE; Status:In Progress - Specimen/Data Collected;   Done: 06Jan2015 Health Maintenance  4. UA With REFLEX; [Do Not Release]; Status:Complete;   Done: 06Jan2015  02:50PM  Discussion/Summary I went over the CAT scan findings with the patient. He is eager to address his kidney stones. I then went over the different treatment options for him. In the past he has not had success with fragmentation extracorporal lithotripsy given the hard density of the stone. We went over both ureteroscopy and percutaneous nephrolithotomy. I recommended that the patient undergo PCNL on the right side given the size of the ureteral stone in the stones within the lower pole. We also discussed the obstructing stones in the left side, even likely be better treated with ureteroscopy. Patient is eager to get this done, we will attempt left ureteroscopy and right percutaneous nephrolithotomy in the same anesthesia. I discussed the risks and the benefits of this procedure and in this combination. Patient understands that there is a significant risk of perinephric hematoma/bleeding following percutaneous nephrolithotomy. In addition the patient is well aware that he will need stents bilaterally if we do do both sides. I will plan not to leave a string and a stent will be removed in the office.

## 2013-07-22 NOTE — Anesthesia Postprocedure Evaluation (Signed)
Anesthesia Post Note  Patient: Edward [name]  Procedure(s) Performed: Procedure(s) (LRB): CYSTOSCOPY WITH LEFT AND RIGHT  URETEROSCOPY ,HOLMIUM LASER LITHTRIPSY,  STENT PLACEMENT, RIGHT AND LEFT  RETROGRAM PYELOGRAM  (Bilateral) RIGHT PERCUTANEOUS NEPHROLITHOTOMY SURGEON ACCESS  (Right) HOLMIUM LASER APPLICATION (Bilateral)  Anesthesia type: General  Patient location: PACU  Post pain: Pain level controlled  Post assessment: Post-op Vital signs reviewed  Last Vitals: BP 118/77  Pulse 89  Temp(Src) 36.5 C (Oral)  Resp 15  Ht 6\' 1"  (1.854 m)  Wt 277 lb (125.646 kg)  BMI 36.55 kg/m2  SpO2 91%  Post vital signs: Reviewed  Level of consciousness: sedated  Complications: No apparent anesthesia complications

## 2013-07-23 DIAGNOSIS — N201 Calculus of ureter: Secondary | ICD-10-CM | POA: Diagnosis not present

## 2013-07-23 LAB — CBC
HCT: 37 % — ABNORMAL LOW (ref 39.0–52.0)
Hemoglobin: 12.1 g/dL — ABNORMAL LOW (ref 13.0–17.0)
MCH: 29.6 pg (ref 26.0–34.0)
MCHC: 32.7 g/dL (ref 30.0–36.0)
MCV: 90.5 fL (ref 78.0–100.0)
PLATELETS: 207 10*3/uL (ref 150–400)
RBC: 4.09 MIL/uL — ABNORMAL LOW (ref 4.22–5.81)
RDW: 15.2 % (ref 11.5–15.5)
WBC: 13 10*3/uL — AB (ref 4.0–10.5)

## 2013-07-23 LAB — BASIC METABOLIC PANEL
BUN: 18 mg/dL (ref 6–23)
CALCIUM: 8.8 mg/dL (ref 8.4–10.5)
CO2: 26 meq/L (ref 19–32)
Chloride: 99 mEq/L (ref 96–112)
Creatinine, Ser: 1.46 mg/dL — ABNORMAL HIGH (ref 0.50–1.35)
GFR calc Af Amer: 60 mL/min — ABNORMAL LOW (ref >90)
GFR calc non Af Amer: 52 mL/min — ABNORMAL LOW (ref >90)
Glucose, Bld: 122 mg/dL — ABNORMAL HIGH (ref 70–99)
POTASSIUM: 4.5 meq/L (ref 3.7–5.3)
SODIUM: 136 meq/L — AB (ref 137–147)

## 2013-07-23 MED ORDER — BIOTENE DRY MOUTH MT LIQD
15.0000 mL | Freq: Two times a day (BID) | OROMUCOSAL | Status: DC
  Administered 2013-07-23: 15 mL via OROMUCOSAL

## 2013-07-23 MED ORDER — MELOXICAM 15 MG PO TABS: 15.0000 mg | ORAL_TABLET | Freq: Every day | ORAL | Status: DC

## 2013-07-23 MED ORDER — OXYCODONE HCL 15 MG PO TABS: 15.0000 mg | ORAL_TABLET | ORAL | Status: AC | PRN

## 2013-07-23 MED ORDER — DOCUSATE SODIUM 100 MG PO CAPS: 100.0000 mg | ORAL_CAPSULE | Freq: Two times a day (BID) | ORAL | Status: AC | PRN

## 2013-07-23 NOTE — Progress Notes (Signed)
Urology Inpatient Progress Report  58 year old male status post left ureteroscopy, laser lithotripsy/stone extraction and left ureteral stent placement, right PCNL.  Intv/Subj: No acute events overnight. Patient is without complaint. Tolerated the procedure well, transferred to the floor after an uneventful PACU stay.  Objective: Vital: Filed Vitals:   07/22/13 2123 07/22/13 2336 07/23/13 0203 07/23/13 0534  BP: 128/76  105/52 102/78  Pulse: 89 92 95 91  Temp: 98 F (36.7 C)  98.2 F (36.8 C) 98.1 F (36.7 C)  TempSrc: Oral  Oral Oral  Resp: 18 18 19 18   Height:      Weight:    121.4 kg (267 lb 10.2 oz)  SpO2: 94% 93% 95% 93%   I/Os: I/O last 3 completed shifts: In: 4541.7 [P.O.:960; I.V.:3381.7; IV Piggyback:200] Out: 1640 [Urine:1615; Blood:25]  Past Medical History  Diagnosis Date  . Hypertension   . Hyperlipidemia   . COPD (chronic obstructive pulmonary disease)   . Shortness of breath     with exertion  . Fibromyalgia   . GERD (gastroesophageal reflux disease)   . 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 History of kidney stones     multiple times  . Sleep apnea     uses c pap  . Depression   . Leg pain, bilateral     aching of both legs - chronic   Current Facility-Administered Medications  Medication Dose Route Frequency Provider Last Rate Last Dose  . 0.45 % sodium chloride infusion   Intravenous Continuous Ardis Hughs, MD 100 mL/hr at 07/23/13 0547    . acetaminophen (OFIRMEV) IV 1,000 mg  1,000 mg Intravenous Q6H Ardis Hughs, MD   1,000 mg at 07/23/13 0543  . amitriptyline (ELAVIL) tablet 50-75 mg  50-75 mg Oral QHS Ardis Hughs, MD   75 mg at 07/22/13 2221  . antiseptic oral rinse (BIOTENE) solution 15 mL  15 mL Mouth Rinse BID Ardis Hughs, MD      . bisacodyl (DULCOLAX) suppository 10 mg  10 mg Rectal Daily PRN Ardis Hughs, MD      . citalopram (CELEXA) tablet 40 mg  40 mg Oral q morning - 10a Ardis Hughs, MD       . loratadine (CLARITIN) tablet 10 mg  10 mg Oral Daily Ardis Hughs, MD      . menthol-cetylpyridinium (CEPACOL) lozenge 3 mg  1 lozenge Oral PRN Ardis Hughs, MD      . metoprolol succinate (TOPROL-XL) 24 hr tablet 50 mg  50 mg Oral QHS Ardis Hughs, MD   50 mg at 07/22/13 2221  . morphine 4 MG/ML injection 4 mg  4 mg Intravenous Q1H PRN Ardis Hughs, MD      . neomycin-bacitracin-polymyxin (NEOSPORIN) ointment 1 application  1 application Topical TID PRN Ardis Hughs, MD      . ondansetron [facility]) injection 4 mg  4 mg Intravenous Q4H PRN Ardis Hughs, MD   4 mg at 07/22/13 2110  . ondansetron (ZOFRAN) tablet 4 mg  4 mg Oral Q8H PRN Ardis Hughs, MD      . opium-belladonna (B&O SUPPRETTES) suppository 1 suppository  1 suppository Rectal Q6H PRN Ardis Hughs, MD      . oxyCODONE (Oxy IR/ROXICODONE) immediate release tablet 15 mg  15 mg Oral Q3H PRN Ardis Hughs, MD   15 mg at 07/23/13 0612  . pantoprazole (PROTONIX) EC tablet 40 mg  40 mg Oral Daily Ardis Hughs, MD   40 mg at 07/22/13 2220  . phenol (CHLORASEPTIC) mouth spray 1 spray  1 spray Mouth/Throat PRN Ardis Hughs, MD      . polyethylene glycol [facility] / GLYCOLAX) packet 17 g  17 g Oral QHS Ardis Hughs, MD   17 g at 07/22/13 2220  . simvastatin (ZOCOR) tablet 40 mg  40 mg Oral QHS Ardis Hughs, MD   40 mg at 07/22/13 2220  . zolpidem (AMBIEN) tablet 10 mg  10 mg Oral QHS PRN Ardis Hughs, MD   10 mg at 07/22/13 2226    Physical Exam:  General: Patient is in no apparent distress Lungs: Normal respiratory effort, chest expands symmetrically. GI: The abdomen is soft and nontender without mass.  GU: Nephrostomy tube is draining dark urine consistent with old blood, Foley catheter is draining straw-colored urine.  Nephrostomy tube site is clean dry and intact Ext: lower extremities symmetric  Lab Results:  Recent Labs  07/22/13 2100  07/23/13 0505  WBC 10.1 13.0*  HGB 13.1 12.1*  HCT 39.3 37.0*    Recent Labs  07/22/13 2100 07/23/13 0505  NA 138 136*  K 4.6 4.5  CL 99 99  CO2 25 26  GLUCOSE 167* 122*  BUN 15 18  CREATININE 1.41* 1.46*  CALCIUM 8.9 8.8   No results found for this basename: LABPT, INR,  in the last 72 hours No results found for this basename: LABURIN,  in the last 72 hours Results for orders placed during the hospital encounter of 07/18/13  URINE CULTURE     Status: None   Collection Time    07/18/13  3:46 PM      Result Value Range Status   Specimen Description URINE, CLEAN CATCH   Final   Special Requests NONE   Final   Culture  Setup Time     Final   Value: 07/18/2013 22:32     Performed at [place]     Final   Value: NO GROWTH     Performed at [facility]   Culture     Final   Value: NO GROWTH     Performed at [facility]   Report Status 07/19/2013 FINAL   Final    Studies/Results:   Assessment: 1 Day Post-Op status post left ureteroscopy, laser lithotripsy/stone extraction and left ureteral stent placement, right PCNL. Patient is overall doing well.  Hemoglobin is slightly diminished today this is likely from acute surgical blood loss and dilution.  I do not think there is ongoing bleeding.  His renal function is also somewhat compromised, but I expect this to be transient.   Plan: The nephrostomy tube was removed on rounds today Foley catheter will be removed around noon today If Patient is voiding well and he is otherwise met discharge criteria expect the patient to be discharged sometime around 2:00 this afternoon. Patient is scheduled for second stage ureteroscopy on the left side. Ardis Hughs 07/23/2013, 9:54 AM

## 2013-07-23 NOTE — Discharge Summary (Signed)
Date of admission: 07/22/2013  Date of discharge: 07/23/2013  Admission diagnosis: Bilateral obstructing ureteral stones, bilateral nephrolithiasis  Discharge diagnosis: Chronic lower extremity pain, same as above, sleep apnea  Secondary diagnoses:  Patient Active Problem List   Diagnosis Date Noted  . Nephrolithiasis 07/22/2013  . Renal calculi 07/22/2013    History and Physical: For full details, please see admission history and physical. Briefly, Edward [name] is a 58 y.o. year old patient with A long history of kidney stones.He presented to my clinic with bilateral stripping ureteral stones with a large stone burden in each kidney.  After reviewing the options patient agreed to proceed with left-sided ureteroscopy and right-sided PCNL.   Hospital Course: Patient tolerated the procedure well.  He was then transferred to the floor after an uneventful PACU stay.  His hospital course was uncomplicated. His nephrostomy tube was removed on postoperative day 1 in the morning.  His Foley catheter was subsequently removed 3 hours later. On POD#1  he had met discharge criteria: was eating a regular diet, was up and ambulating independently,  pain was well controlled, was voiding without a catheter, and was ready to for discharge.   Laboratory values:   Recent Labs  07/22/13 2100 07/23/13 0505  WBC 10.1 13.0*  HGB 13.1 12.1*  HCT 39.3 37.0*    Recent Labs  07/22/13 2100 07/23/13 0505  NA 138 136*  K 4.6 4.5  CL 99 99  CO2 25 26  GLUCOSE 167* 122*  BUN 15 18  CREATININE 1.41* 1.46*  CALCIUM 8.9 8.8   No results found for this basename: LABPT, INR,  in the last 72 hours No results found for this basename: LABURIN,  in the last 72 hours Results for orders placed during the hospital encounter of 07/18/13  URINE CULTURE     Status: None   Collection Time    07/18/13  3:46 PM      Result Value Range Status   Specimen Description URINE, CLEAN CATCH   Final   Special Requests NONE    Final   Culture  Setup Time     Final   Value: 07/18/2013 22:32     Performed at [place]     Final   Value: NO GROWTH     Performed at [facility]   Culture     Final   Value: NO GROWTH     Performed at [facility]   Report Status 07/19/2013 FINAL   Final    Disposition: Home  Discharge instruction: The patient was instructed to be ambulatory but told to refrain from heavy lifting, strenuous activity, or driving.   Discharge medications:    Medication List    ASK your doctor about these medications       amitriptyline 25 MG tablet  Commonly known as:  ELAVIL  Take 50-75 mg by mouth at bedtime.     cetirizine 10 MG tablet  Commonly known as:  ZYRTEC  Take 10 mg by mouth every morning.     citalopram 40 MG tablet  Commonly known as:  CELEXA  Take 40 mg by mouth every morning.     cyanocobalamin 1000 MCG/ML injection  Commonly known as:  (VITAMIN B-12)  Inject 1,000 mcg into the muscle every 30 (thirty) days.     hydrochlorothiazide 12.5 MG tablet  Commonly known as:  HYDRODIURIL  Take 12.5 mg by mouth at bedtime.     KLS SLEEP  AID 25 MG tablet  Generic drug:  doxylamine (Sleep)  Take 25 mg by mouth at bedtime.     Melatonin 3 MG Tabs  Take 3 mg by mouth at bedtime.     meloxicam 15 MG tablet  Commonly known as:  MOBIC  Take 15 mg by mouth at bedtime.     metoprolol succinate 25 MG 24 hr tablet  Commonly known as:  TOPROL-XL  Take 50 mg by mouth at bedtime.     MIRALAX PO  Take 17 g by mouth at bedtime.     multivitamin with minerals Tabs tablet  Take 1 tablet by mouth every morning.     omeprazole 20 MG capsule  Commonly known as:  PRILOSEC  Take 20 mg by mouth every morning.     oxyCODONE 15 MG immediate release tablet  Commonly known as:  ROXICODONE  Take 7.5 mg by mouth every 3 (three) hours as needed for pain.     simvastatin 40 MG tablet  Commonly known as:  ZOCOR  Take 40 mg by mouth at  bedtime.     testosterone cypionate 200 MG/ML injection  Commonly known as:  DEPOTESTOTERONE CYPIONATE  Inject 200 mg into the muscle every 14 (fourteen) days.        Followup:      Follow-up Information   Follow up with Ardis Hughs, MD On 08/05/2013. (Follow-up surgery on left kidney stones)    Specialty:  Urology   Contact information:   [facility]  [place] [address] [phone]

## 2013-07-23 NOTE — Discharge Instructions (Signed)
Discharge instructions following PCNL  Call your doctor for: Fevers greater than 100.5 Severe nausea or vomiting Increasing pain not controlled by pain medication Increasing redness or drainage from incisions Decreased urine output or a catheter is no longer draining  The number for questions is [phone].  Activity: Gradually increase activity with short frequent walks, 3-4 times a day.  Avoid strenuous activities, like sports, lawn-mowing, or heavy lifting (more than 10-15 pounds).  Wear loose, comfortable clothing that pull or kink the tube or tubes.  Do not drive while taking pain medication, or until your doctor permitts it.  Bathing and dressing changes: You should not shower for 48 hours after surgery.  Do not soak your back in a bathtub for 2 weeks.  Drainage bag care: The drainage bag should be secured such that it never pulls or loosens to prevent it from leaking.  It is important to wash her hands before and after emptying the drainage bag to help prevent the spread of infection.  The drainage bag should be emptied as needed.  When the wound stops draining or it is manageable with a dry gauze dressing, you can remove the bag.  Diet: It is extremely important to drink plenty of fluids after surgery, especially water.  You may resume your regular diet, unless otherwise instructed.  Medications: May take Tylenol (acetaminophen) or ibuprofen (Advil, Motrin) as directed over-the-counter. Take any prescriptions as directed.  Follow-up appointments: Follow-up Surgery will be scheduled with Dr. Marlou PorchHerrick in On August 05, 2013 for Second stage left-sided ureteroscopy.

## 2013-07-25 ENCOUNTER — Encounter (HOSPITAL_COMMUNITY): Payer: Self-pay | Admitting: Urology

## 2013-07-25 ENCOUNTER — Other Ambulatory Visit: Payer: Self-pay | Admitting: Urology

## 2013-07-26 ENCOUNTER — Encounter (HOSPITAL_BASED_OUTPATIENT_CLINIC_OR_DEPARTMENT_OTHER): Payer: Self-pay | Admitting: *Deleted

## 2013-07-28 ENCOUNTER — Encounter (HOSPITAL_BASED_OUTPATIENT_CLINIC_OR_DEPARTMENT_OTHER): Payer: Self-pay | Admitting: *Deleted

## 2013-07-28 NOTE — Progress Notes (Signed)
NPO AFTER MN. ARRIVE AT [id number]. CURRENT EKG IN EPIC AND CHART.  PT TO GET CBC AND BMET DONE ON WED. 08-03-2013 AT 1000. WILL TAKE PRILOSEC, ZYRTEC, AND CELEXA AM DOS W/ SIPS OF WATER AND IF NEEDED OXYCODONE.

## 2013-08-03 LAB — BASIC METABOLIC PANEL
BUN: 11 mg/dL (ref 6–23)
CALCIUM: 9.4 mg/dL (ref 8.4–10.5)
CO2: 30 mEq/L (ref 19–32)
Chloride: 98 mEq/L (ref 96–112)
Creatinine, Ser: 1.25 mg/dL (ref 0.50–1.35)
GFR, EST AFRICAN AMERICAN: 72 mL/min — AB (ref >90)
GFR, EST NON AFRICAN AMERICAN: 62 mL/min — AB (ref >90)
GLUCOSE: 111 mg/dL — AB (ref 70–99)
POTASSIUM: 3.7 meq/L (ref 3.7–5.3)
SODIUM: 137 meq/L (ref 137–147)

## 2013-08-03 LAB — CBC
HCT: 40.5 % (ref 39.0–52.0)
HEMOGLOBIN: 13.7 g/dL (ref 13.0–17.0)
MCH: 30.3 pg (ref 26.0–34.0)
MCHC: 33.8 g/dL (ref 30.0–36.0)
MCV: 89.6 fL (ref 78.0–100.0)
PLATELETS: 308 10*3/uL (ref 150–400)
RBC: 4.52 MIL/uL (ref 4.22–5.81)
RDW: 13.9 % (ref 11.5–15.5)
WBC: 8.5 10*3/uL (ref 4.0–10.5)

## 2013-08-05 ENCOUNTER — Encounter (HOSPITAL_BASED_OUTPATIENT_CLINIC_OR_DEPARTMENT_OTHER): Payer: Self-pay

## 2013-08-05 ENCOUNTER — Ambulatory Visit (HOSPITAL_BASED_OUTPATIENT_CLINIC_OR_DEPARTMENT_OTHER): Payer: BC Managed Care – PPO | Admitting: Anesthesiology

## 2013-08-05 ENCOUNTER — Ambulatory Visit (HOSPITAL_BASED_OUTPATIENT_CLINIC_OR_DEPARTMENT_OTHER)
Admission: RE | Admit: 2013-08-05 | Discharge: 2013-08-05 | Disposition: A | Discharge status: Home / Self Care | Payer: BC Managed Care – PPO | Source: Ambulatory Visit | Attending: Urology | Admitting: Urology

## 2013-08-05 ENCOUNTER — Encounter (HOSPITAL_BASED_OUTPATIENT_CLINIC_OR_DEPARTMENT_OTHER)
Admission: RE | Disposition: A | Discharge status: Home / Self Care | Payer: Self-pay | Source: Ambulatory Visit | Attending: Urology

## 2013-08-05 ENCOUNTER — Encounter (HOSPITAL_BASED_OUTPATIENT_CLINIC_OR_DEPARTMENT_OTHER): Payer: BC Managed Care – PPO | Admitting: Anesthesiology

## 2013-08-05 DIAGNOSIS — Z87891 Personal history of nicotine dependence: Secondary | ICD-10-CM | POA: Insufficient documentation

## 2013-08-05 DIAGNOSIS — F411 Generalized anxiety disorder: Secondary | ICD-10-CM | POA: Insufficient documentation

## 2013-08-05 DIAGNOSIS — I10 Essential (primary) hypertension: Secondary | ICD-10-CM | POA: Insufficient documentation

## 2013-08-05 DIAGNOSIS — K219 Gastro-esophageal reflux disease without esophagitis: Secondary | ICD-10-CM | POA: Insufficient documentation

## 2013-08-05 DIAGNOSIS — E669 Obesity, unspecified: Secondary | ICD-10-CM | POA: Insufficient documentation

## 2013-08-05 DIAGNOSIS — IMO0001 Reserved for inherently not codable concepts without codable children: Secondary | ICD-10-CM | POA: Insufficient documentation

## 2013-08-05 DIAGNOSIS — G4730 Sleep apnea, unspecified: Secondary | ICD-10-CM | POA: Insufficient documentation

## 2013-08-05 DIAGNOSIS — Z79899 Other long term (current) drug therapy: Secondary | ICD-10-CM | POA: Insufficient documentation

## 2013-08-05 DIAGNOSIS — F329 Major depressive disorder, single episode, unspecified: Secondary | ICD-10-CM | POA: Insufficient documentation

## 2013-08-05 DIAGNOSIS — N200 Calculus of kidney: Secondary | ICD-10-CM

## 2013-08-05 DIAGNOSIS — N201 Calculus of ureter: Secondary | ICD-10-CM | POA: Insufficient documentation

## 2013-08-05 DIAGNOSIS — F3289 Other specified depressive episodes: Secondary | ICD-10-CM | POA: Insufficient documentation

## 2013-08-05 DIAGNOSIS — E7800 Pure hypercholesterolemia, unspecified: Secondary | ICD-10-CM | POA: Insufficient documentation

## 2013-08-05 DIAGNOSIS — F1920 Other psychoactive substance dependence, uncomplicated: Secondary | ICD-10-CM | POA: Insufficient documentation

## 2013-08-05 DIAGNOSIS — Z9089 Acquired absence of other organs: Secondary | ICD-10-CM | POA: Insufficient documentation

## 2013-08-05 DIAGNOSIS — N1330 Unspecified hydronephrosis: Secondary | ICD-10-CM | POA: Insufficient documentation

## 2013-08-05 HISTORY — DX: Calculus of kidney: N20.0

## 2013-08-05 HISTORY — PX: HOLMIUM LASER APPLICATION: SHX5852

## 2013-08-05 HISTORY — DX: Personal history of other infectious and parasitic diseases: Z86.19

## 2013-08-05 HISTORY — PX: CYSTOSCOPY WITH RETROGRADE PYELOGRAM, URETEROSCOPY AND STENT PLACEMENT: SHX5789

## 2013-08-05 HISTORY — DX: Presence of spectacles and contact lenses: Z97.3

## 2013-08-05 HISTORY — DX: Obstructive sleep apnea (adult) (pediatric): G47.33

## 2013-08-05 HISTORY — DX: Dependence on other enabling machines and devices: Z99.89

## 2013-08-05 SURGERY — CYSTOURETEROSCOPY, WITH RETROGRADE PYELOGRAM AND STENT INSERTION
Anesthesia: General | Site: Ureter | Laterality: Left

## 2013-08-05 MED ORDER — PROMETHAZINE HCL 25 MG/ML IJ SOLN
6.2500 mg | INTRAMUSCULAR | Status: DC | PRN
  Filled 2013-08-05: qty 1

## 2013-08-05 MED ORDER — BELLADONNA ALKALOIDS-OPIUM 16.2-60 MG RE SUPP
RECTAL | Status: DC | PRN
  Administered 2013-08-05 (×2): 1 via RECTAL

## 2013-08-05 MED ORDER — KETOROLAC TROMETHAMINE 30 MG/ML IJ SOLN
INTRAMUSCULAR | Status: DC | PRN
  Administered 2013-08-05: 30 mg via INTRAVENOUS

## 2013-08-05 MED ORDER — LIDOCAINE HCL (CARDIAC) 20 MG/ML IV SOLN
INTRAVENOUS | Status: DC | PRN
  Administered 2013-08-05: 100 mg via INTRAVENOUS

## 2013-08-05 MED ORDER — CIPROFLOXACIN IN D5W 400 MG/200ML IV SOLN
400.0000 mg | INTRAVENOUS | Status: AC
  Administered 2013-08-05: 400 mg via INTRAVENOUS
  Filled 2013-08-05: qty 200

## 2013-08-05 MED ORDER — METOCLOPRAMIDE HCL 5 MG/ML IJ SOLN
INTRAMUSCULAR | Status: DC | PRN
  Administered 2013-08-05: 10 mg via INTRAVENOUS

## 2013-08-05 MED ORDER — LACTATED RINGERS IV SOLN
INTRAVENOUS | Status: DC
Start: 2013-08-05 — End: 2013-08-05
  Administered 2013-08-05 (×3): via INTRAVENOUS
  Filled 2013-08-05: qty 1000

## 2013-08-05 MED ORDER — CIPROFLOXACIN HCL 500 MG PO TABS: 500.0000 mg | ORAL_TABLET | Freq: Two times a day (BID) | ORAL | Status: DC

## 2013-08-05 MED ORDER — ACETAMINOPHEN 10 MG/ML IV SOLN
INTRAVENOUS | Status: DC | PRN
  Administered 2013-08-05: 1000 mg via INTRAVENOUS

## 2013-08-05 MED ORDER — IOHEXOL 350 MG/ML SOLN
INTRAVENOUS | Status: DC | PRN
  Administered 2013-08-05: 20 mL via URETHRAL
  Administered 2013-08-05: 15 mL via INTRAVENOUS

## 2013-08-05 MED ORDER — PROPOFOL 10 MG/ML IV BOLUS
INTRAVENOUS | Status: DC | PRN
  Administered 2013-08-05: 230 mg via INTRAVENOUS
  Administered 2013-08-05: 30 mg via INTRAVENOUS
  Administered 2013-08-05: 40 mg via INTRAVENOUS

## 2013-08-05 MED ORDER — MIDAZOLAM HCL 5 MG/5ML IJ SOLN
INTRAMUSCULAR | Status: DC | PRN
  Administered 2013-08-05: 2 mg via INTRAVENOUS

## 2013-08-05 MED ORDER — SUCCINYLCHOLINE CHLORIDE 20 MG/ML IJ SOLN
INTRAMUSCULAR | Status: DC | PRN
  Administered 2013-08-05: 120 mg via INTRAVENOUS

## 2013-08-05 MED ORDER — DEXAMETHASONE SODIUM PHOSPHATE 4 MG/ML IJ SOLN
INTRAMUSCULAR | Status: DC | PRN
  Administered 2013-08-05: 10 mg via INTRAVENOUS

## 2013-08-05 MED ORDER — ROCURONIUM BROMIDE 100 MG/10ML IV SOLN
INTRAVENOUS | Status: DC | PRN
  Administered 2013-08-05: 30 mg via INTRAVENOUS

## 2013-08-05 MED ORDER — FENTANYL CITRATE 0.05 MG/ML IJ SOLN
INTRAMUSCULAR | Status: DC | PRN
  Administered 2013-08-05: 50 ug via INTRAVENOUS
  Administered 2013-08-05 (×2): 25 ug via INTRAVENOUS
  Administered 2013-08-05 (×2): 50 ug via INTRAVENOUS

## 2013-08-05 MED ORDER — FENTANYL CITRATE 0.05 MG/ML IJ SOLN
INTRAMUSCULAR | Status: AC
  Filled 2013-08-05: qty 6

## 2013-08-05 MED ORDER — BELLADONNA ALKALOIDS-OPIUM 16.2-60 MG RE SUPP
RECTAL | Status: AC
  Filled 2013-08-05: qty 1

## 2013-08-05 MED ORDER — EPHEDRINE SULFATE 50 MG/ML IJ SOLN
INTRAMUSCULAR | Status: DC | PRN
  Administered 2013-08-05 (×2): 10 mg via INTRAVENOUS

## 2013-08-05 MED ORDER — LIDOCAINE HCL 2 % EX GEL
CUTANEOUS | Status: DC | PRN
  Administered 2013-08-05: 1 via URETHRAL

## 2013-08-05 MED ORDER — NEOSTIGMINE METHYLSULFATE 1 MG/ML IJ SOLN
INTRAMUSCULAR | Status: DC | PRN
  Administered 2013-08-05: 5 mg via INTRAVENOUS

## 2013-08-05 MED ORDER — GLYCOPYRROLATE 0.2 MG/ML IJ SOLN
INTRAMUSCULAR | Status: DC | PRN
  Administered 2013-08-05: 0.6 mg via INTRAVENOUS

## 2013-08-05 MED ORDER — ONDANSETRON HCL 4 MG/2ML IJ SOLN
INTRAMUSCULAR | Status: DC | PRN
Start: 2013-08-05 — End: 2013-08-05
  Administered 2013-08-05: 4 mg via INTRAVENOUS

## 2013-08-05 MED ORDER — SODIUM CHLORIDE 0.9 % IR SOLN
Status: DC | PRN
  Administered 2013-08-05: 6000 mL
  Administered 2013-08-05: 5000 mL

## 2013-08-05 MED ORDER — MIDAZOLAM HCL 2 MG/2ML IJ SOLN
INTRAMUSCULAR | Status: AC
  Filled 2013-08-05: qty 2

## 2013-08-05 MED ORDER — FENTANYL CITRATE 0.05 MG/ML IJ SOLN
25.0000 ug | INTRAMUSCULAR | Status: DC | PRN
  Filled 2013-08-05: qty 1

## 2013-08-05 SURGICAL SUPPLY — 39 items
BAG DRAIN URO-CYSTO SKYTR STRL (DRAIN) ×4 IMPLANT
BASKET LASER NITINOL 1.9FR (BASKET) IMPLANT
BASKET STNLS GEMINI 4WIRE 3FR (BASKET) IMPLANT
BASKET STONE NCOMPASS (UROLOGICAL SUPPLIES) ×4 IMPLANT
BASKET ZERO TIP NITINOL 2.4FR (BASKET) IMPLANT
BENZOIN TINCTURE PRP APPL 2/3 (GAUZE/BANDAGES/DRESSINGS) ×4 IMPLANT
CANISTER SUCT LVC 12 LTR MEDI- (MISCELLANEOUS) ×4 IMPLANT
CATH CLEAR GEL 3F BACKSTOP (CATHETERS) ×4 IMPLANT
CATH URET 5FR 28IN OPEN ENDED (CATHETERS) ×8 IMPLANT
CATH URET DUAL LUMEN 6-10FR 50 (CATHETERS) IMPLANT
CLOTH BEACON ORANGE TIMEOUT ST (SAFETY) ×4 IMPLANT
CONTOUR STENT ×4 IMPLANT
DRAPE CAMERA CLOSED 9X96 (DRAPES) ×4 IMPLANT
DRSG TEGADERM 2-3/8X2-3/4 SM (GAUZE/BANDAGES/DRESSINGS) ×4 IMPLANT
EXTRACTOR STONE NITINOL NGAGE (UROLOGICAL SUPPLIES) ×12 IMPLANT
FIBER LASER FLEXIVA 200 (UROLOGICAL SUPPLIES) ×4 IMPLANT
FIBER LASER FLEXIVA 365 (UROLOGICAL SUPPLIES) IMPLANT
GLOVE BIO SURGEON STRL SZ7.5 (GLOVE) ×4 IMPLANT
GOWN STRL REIN XL XLG (GOWN DISPOSABLE) ×4 IMPLANT
GOWN STRL REUS W/ TWL LRG LVL3 (GOWN DISPOSABLE) ×2 IMPLANT
GOWN STRL REUS W/TWL LRG LVL3 (GOWN DISPOSABLE) ×2
GOWN STRL REUS W/TWL XL LVL3 (GOWN DISPOSABLE) ×4 IMPLANT
GUIDEWIRE 0.038 PTFE COATED (WIRE) IMPLANT
GUIDEWIRE ANG ZIPWIRE 038X150 (WIRE) IMPLANT
GUIDEWIRE STR DUAL SENSOR (WIRE) ×8 IMPLANT
IV NS 1000ML (IV SOLUTION) ×4
IV NS 1000ML BAXH (IV SOLUTION) ×4 IMPLANT
IV NS IRRIG 3000ML ARTHROMATIC (IV SOLUTION) ×4 IMPLANT
KIT BALLIN UROMAX 15FX10 (LABEL) IMPLANT
KIT BALLN UROMAX 15FX4 (MISCELLANEOUS) IMPLANT
KIT BALLN UROMAX 26 75X4 (MISCELLANEOUS)
NS IRRIG 500ML POUR BTL (IV SOLUTION) IMPLANT
PACK CYSTOSCOPY (CUSTOM PROCEDURE TRAY) ×4 IMPLANT
SET HIGH PRES BAL DIL (LABEL)
SHEATH ACCESS URETERAL 38CM (SHEATH) ×4 IMPLANT
SHEATH ACCESS URETERAL 54CM (SHEATH) IMPLANT
STENT URET 6FRX26 CONTOUR (STENTS) ×4 IMPLANT
TUBE FEEDING 8FR 16IN STR KANG (MISCELLANEOUS) ×4 IMPLANT
VALVE ENDOSCOPIC W/ADAPTER (MISCELLANEOUS) ×4 IMPLANT

## 2013-08-05 NOTE — Transfer of Care (Signed)
Immediate Anesthesia Transfer of Care Note  Patient: Edward MowersKevan E Kuhnert  Procedure(s) Performed: Procedure(s) (LRB): BILATERAL URETEROSCOPY WITH BILATERAL RETROGRADE PYELOGRAM, RIGHT STENT REMOVAL, LEFT LASER LITHOTRIPSY  AND LEFT URETEDRAL STENT EXCHANGE (Bilateral) HOLMIUM LASER APPLICATION (Left)  Patient Location: PACU  Anesthesia Type: General  Level of Consciousness: awake, alert  and oriented  Airway & Oxygen Therapy: Patient Spontanous Breathing and Patient connected to face mask oxygen  Post-op Assessment: Report given to PACU RN and Post -op Vital signs reviewed and stable  Post vital signs: Reviewed and stable  Complications: No apparent anesthesia complications

## 2013-08-05 NOTE — Op Note (Signed)
Preoperative diagnosis:  1. Bilateral nephrolithiasis  Postoperative diagnosis:  1. As above   Procedure: 1. Cystoscopy, right retrograde pyelogram with interpretation, right ureteroscopy, right ureteral stent removal 2. Left retrograde pyelogram, left ureteroscopy, left laser lithotripsy stone extraction, left ureteral stent exchange  Surgeon: Crist FatBenjamin W. Herrick, MD  Anesthesia: General  Complications: None  Intraoperative findings: The right retrograde pyelogram revealed a dilated right collecting system with a normal caliber ureter, there were no filling defects. The left retrograde pyelogram demonstrated a filling defect in the proximal ureter consistent with the patient's known ureteral stone. Proximal to the stone there was hydronephrosis. I was unable to get this patient stone free on the left side, because there was a large stone in the posterior calyx of the lower pole which I could not basket or manipulate because of the angle. A 26 cm x 6 French double-J ureteral stent was replaced in the left ureter.  EBL: Minimal  Specimens: None  Indication: Marcie MowersKevan E Lugar is a 58 y.o. patient with extensive history of nephrolithiasis. He presented approximately 10 days ago for left ureteroscopy/laser lithotripsy/stent placement and right percutaneous nephrolithotomy. He presents today for second stage ureteroscopy on the left side, and stent removal on the right side.  After reviewing the management options for treatment, he elected to proceed with the above surgical procedure(s). We have discussed the potential benefits and risks of the procedure, side effects of the proposed treatment, the likelihood of the patient achieving the goals of the procedure, and any potential problems that might occur during the procedure or recuperation. Informed consent has been obtained.  Description of procedure:  The patient was taken to the operating room and general anesthesia was induced.  The patient  was placed in the dorsal lithotomy position, prepped and draped in the usual sterile fashion, and preoperative antibiotics were administered. A preoperative time-out was performed.   A 22 French 30 cystoscope was then gently passed to the patient's urethra and into the bladder under visual guidance. The stent emanating from the patient's right ureteral orifice was then grasped with a stent grasper and pulled down to the urethral meatus. A 0.38 sensor wire was then advanced into the stent and up into the right renal pelvis. The stent was then removed over the guidewire. A retrograde pyelogram was then performed by passing the 5 [place] open-ended ureteral catheter over the wire and into the renal pelvis and removing the wire. I then pulled back on the [facility] catheter and performed a retrograde pyelogram on the right side with the above findings. I then reinserted the wire through the [place] and remove the [place]. I then used a flexible ureteroscope and gently passed to the patient's urethra and into the bladder and then into the right ureter. I then inspected the ureter up to the renal pelvis and found no residual fragments. There were several papillary tip calcifications within the right collecting system, however there were no residual stone fragments. I then backed out the ureteroscope slowly and removed the wire.  I then turned my attention to the patient's left side. I started by grabbing the stent emanating from the left ureteral orifice and pulling it to the urethral meatus. I then passed a 0.38 sensor wire into the left renal pelvis through the stent and remove the stent over the wire. I then performed a retrograde pyelogram on the left side by passing a 5 [place] over the wire and into the collecting system. I then removed the wire and injected  contrast. The above findings were noted. I then reinserted the wire into the Pollack catheter and advance the wire to the renal pelvis. I then removed  the [facility] catheter over the wire. I then started ureteroscopy using the 4/6 French semirigid long ureteroscope. I did encounter stone in the proximal collecting system which I was able to laser into smaller fragments. I did use the backstop to minimize fragments from being present in the renal pelvis. Once the stone was removed I advanced the scope into the renal pelvis and encountered several more large stones. I pulled these down into the proximal ureter and left them there where they were laser. I then inserted a second wire and passed a 12/14 ureteral access sheath over the second wire and removed the wire. I then was able to laser the stones using a 200  fiber in the proximal ureter and remove the fragments using the in gauge basket. I then continued into the kidney again and found a smaller stone in the lower pole in the posterior calyx which I was unable to grab with the basket secondary to the angle of the calyx. As such, the stone remains of the patient's renal pelvis. I then reinserted the inner part of the 14 French ureteral access sheath and injected contrast per providing a roadmap for the stent placement. I then slowly backed out the ureteral access sheath under visual guidance sweeping of the ureter using the escape basket. Once the scope and been removed I passed a 26 cm x 6 French double-J ureteral stent over the wire and into the left renal pelvis. The wire was then removed and a nice curl was noted in the renal pelvis as well as the bladder. The bladder was then emptied. I did leave the stent tether on and secured to the dorsum of the patient's penis.  The patient was then awoken and returned to PACU in excellent condition.  Disposition: The patient was instructed to remove his stent in 7 days. He'll see the patient back in 6 weeks the renal Prior.  Crist Fat, M.D.

## 2013-08-05 NOTE — Interval H&P Note (Signed)
History and Physical Interval Note: Presents for the second stage of his bilateral kidney stone removal.  Plan is to remove his right stent and quickly perform URS to ensure that the right side is stone free.  Then we'll focus on left side and work to get that side stone free as well.  Procedure reviewed with patient in detail and he has agreed to proceed.  08/05/2013 5:50 AM  Jerrian Angela BurkeE Declercq  has presented today for surgery, with the diagnosis of left renal calculi  The various methods of treatment have been discussed with the patient and family. After consideration of risks, benefits and other options for treatment, the patient has consented to  Procedure(s): BILATERAL URETEROSCOPY WITH BILATERAL RETROGRADE PYELOGRAM, RIGHT STENT REMOVAL, LEFT LASER LITHOTRIPSY  AND LEFT URETEDRAL STENT EXCHANGE (Bilateral) HOLMIUM LASER APPLICATION (N/A) as a surgical intervention .  The patient's history has been reviewed, patient examined, no change in status, stable for surgery.  I have reviewed the patient's chart and labs.  Questions were answered to the patient's satisfaction.     Berniece SalinesHERRICK, Derrich Gaby W

## 2013-08-05 NOTE — Discharge Instructions (Addendum)
Post Anesthesia Home Care Instructions  Activity: Get plenty of rest for the remainder of the day. A responsible adult should stay with you for 24 hours following the procedure.  For the next 24 hours, DO NOT: -Drive a car -Advertising copywriter -Drink alcoholic beverages -Take any medication unless instructed by your physician -Make any legal decisions or sign important papers.  Meals: Start with liquid foods such as gelatin or soup. Progress to regular foods as tolerated. Avoid greasy, spicy, heavy foods. If nausea and/or vomiting occur, drink only clear liquids until the nausea and/or vomiting subsides. Call your physician if vomiting continues.  Special Instructions/Symptoms: Your throat may feel dry or sore from the anesthesia or the breathing tube placed in your throat during surgery. If this causes discomfort, gargle with warm salt water. The discomfort should disappear within 24 hours.  DISCHARGE INSTRUCTIONS FOR KIDNEY STONES OR URETERAL STENT   MEDICATIONS:  1. DO NOT RESUME YOUR ASPIRIN, or any other medicines like ibuprofen, motrin, excedrin, advil, aleve, vitamin E, fish oil as these can all cause bleeding x 7 days.  2. Resume all your other meds from home including your pain meds. 3. Take Cipro one hour prior to removal of your stent.    ACTIVITY:  1. No strenuous activity x 1week  2. No driving while on narcotic pain medications  3. Drink plenty of water  4. Continue to walk at home - you can still get blood clots when you are at home, so keep active, but don't over do it.  5. May return to work/school tomorrow or when you feel ready   BATHING:  1. You can shower and we recommend daily showers  2. You have a string coming from your urethra: The stent string is attached to your ureteral stent. Do not pull on this.   SIGNS/SYMPTOMS TO CALL:  Please call us if you have a fever greater than 101.5, uncontrolled nausea/vomiting, uncontrolled pain, dizziness, unable to  urinate, bloody urine, chest pain, shortness of breath, leg swelling, leg pain, redness around wound, drainage from wound, or any other concerns or questions.   You can reach [place] at [phone].   FOLLOW-UP:  1. You have an appointment in 6 weeks with a ultrasound of your kidneys prior.  2. You have a string attached to your stent, you may remove it on 08/12/2013. To do this, pull the strings until the stents are completely removed. You may feel an odd sensation in your back.[facility] [phone] Post Ureteroscopy With or Without Stent Instructions  Definitions:  Ureter: The duct that transports urine from the kidney to the bladder. Stent:   A plastic hollow tube that is placed into the ureter, from the kidney to the                 bladder to prevent the ureter from swelling shut.  GENERAL INSTRUCTIONS:  Despite the fact that no skin incisions were used, the area around the ureter and bladder is raw and irritated. The stent is a foreign body which will further irritate the bladder wall. This irritation is manifested by increased frequency of urination, both day and night, and by an increase in the urge to urinate. In some, the urge to urinate is present almost always. Sometimes the urge is strong enough that you may not be able to stop yourself from urinating. The only real cure is to remove the stent and then give time for the bladder wall to heal which can't be  done until the danger of the ureter swelling shut has passed, which varies.  You may see some blood in your urine while the stent is in place and a few days afterwards. Do not be alarmed, even if the urine was clear for a while. Get off your feet and drink lots of fluids until clearing occurs. If you start to pass clots or don't improve, call us.  DIET: You may return to your normal diet immediately. Because of the raw surface of your bladder, alcohol, spicy foods, acid type foods and drinks with caffeine may cause  irritation or frequency and should be used in moderation. To keep your urine flowing freely and to avoid constipation, drink plenty of fluids during the day ( 8-10 glasses ). Tip: Avoid cranberry juice because it is very acidic.  ACTIVITY: Your physical activity doesn't need to be restricted. However, if you are very active, you may see some blood in your urine. We suggest that you reduce your activity under these circumstances until the bleeding has stopped.  BOWELS: It is important to keep your bowels regular during the postoperative period. Straining with bowel movements can cause bleeding. A bowel movement every other day is reasonable. Use a mild laxative if needed, such as Milk of Magnesia 2-3 tablespoons, or 2 Dulcolax tablets. Call if you continue to have problems. If you have been taking narcotics for pain, before, during or after your surgery, you may be constipated. Take a laxative if necessary.   MEDICATION: You should resume your pre-surgery medications unless told not to. In addition you will often be given an antibiotic to prevent infection. These should be taken as prescribed until the bottles are finished unless you are having an unusual reaction to one of the drugs.  PROBLEMS YOU SHOULD REPORT TO US:  Fevers over 100.5 Fahrenheit.  Heavy bleeding, or clots ( See above notes about blood in urine ).  Inability to urinate.  Drug reactions ( hives, rash, nausea, vomiting, diarrhea ).  Severe burning or pain with urination that is not improving.  FOLLOW-UP: You will need a follow-up appointment to monitor your progress. Call for this appointment at the number listed above. Usually the first appointment will be about three to fourteen days after your surgery.

## 2013-08-05 NOTE — Anesthesia Procedure Notes (Signed)
Procedure Name: Intubation Date/Time: 08/05/2013 10:16 AM Performed by: Norva PavlovALLAWAY, Ramon Brant G Pre-anesthesia Checklist: Patient identified, Emergency Drugs available, Suction available and Patient being monitored Patient Re-evaluated:Patient Re-evaluated prior to inductionOxygen Delivery Method: Circle System Utilized Preoxygenation: Pre-oxygenation with 100% oxygen Intubation Type: IV induction Ventilation: Mask ventilation without difficulty Laryngoscope Size: Mac and 4 Tube type: Oral Tube size: 8.0 mm Number of attempts: 1 Airway Equipment and Method: stylet and oral airway Placement Confirmation: ETT inserted through vocal cords under direct vision,  positive ETCO2 and breath sounds checked- equal and bilateral Secured at: 23 cm Tube secured with: Tape Dental Injury: Teeth and Oropharynx as per pre-operative assessment

## 2013-08-05 NOTE — Anesthesia Preprocedure Evaluation (Signed)
Anesthesia Evaluation  Patient identified by MRN, date of birth, ID band Patient awake    Reviewed: Allergy & Precautions, H&P , NPO status , Patient's Chart, lab work & pertinent test results  Airway Mallampati: II TM Distance: >3 FB Neck ROM: Full    Dental no notable dental hx.    Pulmonary sleep apnea and Continuous Positive Airway Pressure Ventilation , former smoker,  breath sounds clear to auscultation  Pulmonary exam normal       Cardiovascular Exercise Tolerance: Good hypertension, Pt. on medications and Pt. on home beta blockers Rhythm:Regular Rate:Normal     Neuro/Psych PSYCHIATRIC DISORDERS Depression  Neuromuscular disease    GI/Hepatic Neg liver ROS, GERD-  Medicated,  Endo/Other  negative endocrine ROS  Renal/GU Renal disease  negative genitourinary   Musculoskeletal  (+) Fibromyalgia -, narcotic dependent  Abdominal (+) + obese,   Peds negative pediatric ROS (+)  Hematology negative hematology ROS (+)   Anesthesia Other Findings   Reproductive/Obstetrics negative OB ROS                           Anesthesia Physical Anesthesia Plan  ASA: III  Anesthesia Plan: General   Post-op Pain Management:    Induction: Intravenous  Airway Management Planned: Oral ETT  Additional Equipment:   Intra-op Plan:   Post-operative Plan: Extubation in OR  Informed Consent: I have reviewed the patients History and Physical, chart, labs and discussed the procedure including the risks, benefits and alternatives for the proposed anesthesia with the patient or authorized representative who has indicated his/her understanding and acceptance.   Dental advisory given  Plan Discussed with: CRNA  Anesthesia Plan Comments:         Anesthesia Quick Evaluation

## 2013-08-05 NOTE — Anesthesia Postprocedure Evaluation (Signed)
  Anesthesia Post-op Note  Patient: Edward [name]  Procedure(s) Performed: Procedure(s) (LRB): BILATERAL URETEROSCOPY WITH BILATERAL RETROGRADE PYELOGRAM, RIGHT STENT REMOVAL, LEFT LASER LITHOTRIPSY  AND LEFT URETEDRAL STENT EXCHANGE (Bilateral) HOLMIUM LASER APPLICATION (Left)  Patient Location: PACU  Anesthesia Type: General  Level of Consciousness: awake and alert   Airway and Oxygen Therapy: Patient Spontanous Breathing  Post-op Pain: mild  Post-op Assessment: Post-op Vital signs reviewed, Patient's Cardiovascular Status Stable, Respiratory Function Stable, Patent Airway and No signs of Nausea or vomiting  Last Vitals:  Filed Vitals:   08/05/13 0917  BP: 131/76  Pulse: 72  Temp: 36.3 C  Resp: 18    Post-op Vital Signs: stable   Complications: No apparent anesthesia complications

## 2013-08-05 NOTE — H&P (View-Only) (Signed)
Reason For Visit Kidney stones   History of Present Illness A 58 year old gentleman previously seen by Dr. Retta Diones 2009, Dr. Aldean Ast before that, and Dr. Teofilo Pod at [facility] along the way for kidney stones. He is most recently status post right PCN in 12/09. He passes stones all the time he says. He lives in pain. However, his pain is mostly his back and legs, he treated for fibromyalgia and takes chronic Percocet. One of his office visits with Dr. Retta Diones he brought a jar of 20 stones, one of them measuring 12 mm x 8 mm. He has not had any intervention or follow-up since December 2009. He lives with his wife in a motor home, they are constantly traveling back and forth from the [place] to the [place]. The patient was rereferred to urology after undergoing a CT of his chest for some lung nodules which also captured the top part of the urinary tract. He was noted to have right-sided hydronephrosis as well as bilateral kidney stones. This CAT scan was done at [facility] chest and is not available for my review. The patient reports that he does have some right-sided flank and bladder pain as well as some left sided pain. However, he tells me he is not in a significant amount of pain in his kidneys, and that his pain is mostly in his legs. The patient denies any fevers, chills, hematuria or dysuria.  The patient has recently had an evaluation for difficulty breathing. He was started on CPAP at night for his sleep apnea, and also recently had PFTs which she states were normal. He has not had an echocardiogram.   Past Medical History Problems  1. History of esophageal reflux (V12.79) 2. History of hepatitis (V12.09) 3. History of hypercholesterolemia (V12.29) 4. History of hypertension (V12.59)  Surgical History Problems  1. History of Appendectomy 2. History of Cholecystectomy 3. History of Kidney Surgery 4. History of Lithotripsy 5. History of Percutaneous Lithotomy For Stone Over 2cm.  Current  Meds 1. Amitriptyline HCl - 25 MG Oral Tablet;  Therapy: (Recorded:06Jan2015) to Recorded 2. Citalopram Hydrobromide 40 MG Oral Tablet;  Therapy: (Recorded:06Jan2015) to Recorded 3. Cyanocobalamin 2500 MCG Sublingual Tablet Sublingual;  Therapy: (Recorded:06Jan2015) to Recorded 4. Endocet 10-650 MG TABS; TAKE 1 TABLET 4 times daily;  Therapy: (Recorded:19Oct2009) to Recorded 5. Hydrochlorothiazide TABS; 12.5 mg;  Therapy: (Recorded:19Oct2009) to Recorded 6. Melatonin 3 MG Oral Capsule;  Therapy: (Recorded:19Oct2009) to Recorded 7. Meloxicam 15 MG Oral Tablet;  Therapy: (Recorded:19Oct2009) to Recorded 8. Metoprolol Succinate ER 50 MG Oral Tablet Extended Release 24 Hour;  Therapy: (Recorded:06Jan2015) to Recorded 9. Omeprazole 20 MG Oral Capsule Delayed Release;  Therapy: (Recorded:19Oct2009) to Recorded 10. Simvastatin 40 MG Oral Tablet;   Therapy: (Recorded:06Jan2015) to Recorded 11. Sulfamethoxazole-TMP DS 800-160 MG Oral Tablet; TAKE 1 TABLET TWICE DAILY;   Therapy: 17Dec2009 to (Evaluate:27Dec2009); Last Rx:17Dec2009 Ordered 12. Testosterone Cypionate 200 MG/ML OIL;   Therapy: (Recorded:06Jan2015) to Recorded 13. Zyrtec 10 MG TABS;   Therapy: (Recorded:19Oct2009) to Recorded  Allergies Medication  1. Penicillins  Family History Problems  1. Family history of Nephrolithiasis : Father 2. Family history of Nephrolithiasis : Mother  Social History Problems    Denied: History of Alcohol Use   Caffeine Use   caffeine free as much as possible   Marital History - Currently Married   Occupation:   [employer]   Tobacco Use (V15.82)   Smoked for 30-35 yrs and quit 9 yrs ago  Review of Systems Genitourinary, constitutional, skin, eye, otolaryngeal,  hematologic/lymphatic, cardiovascular, pulmonary, endocrine, musculoskeletal, gastrointestinal, neurological and psychiatric system(s) were reviewed and pertinent findings if present are noted.  Genitourinary: urinary  frequency, feelings of urinary urgency, nocturia, hematuria and erectile dysfunction.  Gastrointestinal: heartburn, diarrhea and constipation.  Constitutional: feeling tired (fatigue).  Integumentary: pruritus.  ENT: sinus problems.  Respiratory: shortness of breath.  Musculoskeletal: back pain and joint pain.    Vitals Vital Signs [Data Includes: Last 1 Day]  Recorded: 06Jan2015 03:39PM  Height: 6 ft 1 in Weight: 270 lb  BMI Calculated: 35.62 BSA Calculated: 2.44 Recorded: 06Jan2015 03:21PM  Blood Pressure: 130 / 79 Temperature: 98.3 F Heart Rate: 89  Physical Exam Constitutional: Well nourished and well developed . No acute distress.  ENT:. The ears and nose are normal in appearance.  Neck: The appearance of the neck is normal and no neck mass is present.  Pulmonary: No respiratory distress and normal respiratory rhythm and effort.  Cardiovascular: Heart rate and rhythm are normal . No peripheral edema.  Abdomen: The abdomen is soft and nontender. No masses are palpated. No CVA tenderness. No hernias are palpable. No hepatosplenomegaly noted.  Skin: Normal skin turgor, no visible rash and no visible skin lesions.  Neuro/Psych:. Mood and affect are appropriate.    Results/Data Urine [Data Includes: Last 1 Day]   06Jan2015  COLOR YELLOW   APPEARANCE CLEAR   SPECIFIC GRAVITY 1.010   pH 6.0   GLUCOSE NEG mg/dL  BILIRUBIN NEG   KETONE NEG mg/dL  BLOOD LARGE   PROTEIN NEG mg/dL  UROBILINOGEN 0.2 mg/dL  NITRITE NEG   LEUKOCYTE ESTERASE MOD   SQUAMOUS EPITHELIAL/HPF RARE   WBC 7-10 WBC/hpf  RBC 11-20 RBC/hpf  BACTERIA RARE   CRYSTALS NONE SEEN   CASTS NONE SEEN   Selected Results  UA With REFLEX 06Jan2015 02:50PM Berniece SalinesHerrick, Vanice Rappa  SPECIMEN TYPE: CLEAN CATCH   Test Name Result Flag Reference  COLOR YELLOW  YELLOW  APPEARANCE CLEAR  CLEAR  SPECIFIC GRAVITY 1.010  1.005-1.030  pH 6.0  5.0-8.0  GLUCOSE NEG mg/dL  NEG  BILIRUBIN NEG  NEG  KETONE NEG mg/dL  NEG   BLOOD LARGE A NEG  PROTEIN NEG mg/dL  NEG  UROBILINOGEN 0.2 mg/dL  1.6-1.00.0-1.0  NITRITE NEG  NEG  LEUKOCYTE ESTERASE MOD A NEG  SQUAMOUS EPITHELIAL/HPF RARE  RARE  WBC 7-10 WBC/hpf A <3  RBC 11-20 RBC/hpf A <3  BACTERIA RARE  RARE  CRYSTALS NONE SEEN  NONE SEEN  CASTS NONE SEEN  NONE SEEN   AU CT-STONE PROTOCOL 06Jan2015 12:00AM Berniece SalinesHerrick, Taneika Choi   Test Name Result Flag Reference  CT-STONE PROTOCOL (Report)    ** RADIOLOGY REPORT BY [facility] **   CLINICAL DATA: Microscopic hematuria and right-sided back pain. Ex-smoker. History of renal stones and prior lithotripsy.  EXAM: CT ABDOMEN AND PELVIS WITHOUT CONTRAST (URINARY CALCULUS PROTOCOL)  TECHNIQUE: Multidetector CT imaging was performed through the abdomen and pelvis without intravenous contrast to include the urinary tract.  COMPARISON: DG ABDOMEN 1V dated 06/08/2008; US RENAL dated 04/13/2008  FINDINGS: Lower Chest: Suspicion of a 4 mm right lower lobe lung nodule on image 4 (along a branching vessel). Left lower lobe subpleural nodules x2 on the order of 2 mm. Normal heart size without pericardial or pleural effusion.  Abdomen/Pelvis: Incompletely imaged liver demonstrates caudate lobe prominence and suggestion of atrophy of the medial segment left lobe. Normal spleen, stomach. Fatty atrophy throughout the pancreas. Cholecystectomy without biliary ductal dilatation. Normal adrenal glands.  Bilateral mild renal  atrophy. Bilateral renal collecting system stones. No significant left-sided hydronephrosis. A proximal left ureteric stone measures 8 mm on transverse image 43 and 12 mm on coronal image 105. A distal left ureteric stone or conglomeration of stones measure 9 mm on transverse image 75 and 2.2 cm craniocaudal on coronal image 119.  Mild to moderate right-sided hydroureteronephrosis to the level of a mid right ureteric stone which measures 1.0 cm on image 56/ series 2 transverse and 1.7 cm on  coronal image 96.  Borderline enlarged precarinal node at 1.0 cm on image 40. Periportal node measures 1.5 cm on image 20.  Normal colon and terminal ileum. Normal small bowel without abdominal ascites. No pelvic adenopathy. Normal urinary bladder and prostate. No significant free fluid.  Bones/Musculoskeletal: No acute osseous abnormality.  IMPRESSION: 1. Mild to moderate right-sided hydroureteronephrosis secondary to a mid right ureteric 1.7 cm stone. 2. Multiple left-sided ureteric stones, without significant proximal obstruction. 3. Bilateral nephrolithiasis. 4. Mild upper abdominal adenopathy. Concurrent hepatic findings for which mild cirrhosis is suspected. Correlate with risk factors of liver disease. 5. Lung base nodules. If the patient is at high risk for bronchogenic carcinoma, follow-up chest CT at 1 year is recommended. If the patient is at low risk, no follow-up is needed. This recommendation follows the consensus statement: "Guidelines for Management of Small Pulmonary Nodules Detected on CT Scans: A Statement from the Fleischner Society" as published in Radiology 2005; 237:395-400. Available online at: DietDisorder.cz.   Electronically Signed  By: Jeronimo Greaves M.D.  On: 06/28/2013 17:14   Assessment The patient has bilateral ureteral calculus, the right ureter has an 8 mm x 15 mm obstructing stone with proximal hydronephrosis. There is no hydrocele on the left side. In addition, the patient appears to have thinning the renal parenchyma bilaterally   Plan Calculus of ureter  1. Follow-up Schedule Surgery Office  Follow-up  Status: Complete  Done: 06Jan2015 2. AU CT-STONE PROTOCOL; Status:Complete;   Done: 06Jan2015 12:00AM 3. URINE CULTURE; Status:In Progress - Specimen/Data Collected;   Done: 06Jan2015 Health Maintenance  4. UA With REFLEX; [Do Not Release]; Status:Complete;   Done: 06Jan2015  02:50PM  Discussion/Summary I went over the CAT scan findings with the patient. He is eager to address his kidney stones. I then went over the different treatment options for him. In the past he has not had success with fragmentation extracorporal lithotripsy given the hard density of the stone. We went over both ureteroscopy and percutaneous nephrolithotomy. I recommended that the patient undergo PCNL on the right side given the size of the ureteral stone in the stones within the lower pole. We also discussed the obstructing stones in the left side, even likely be better treated with ureteroscopy. Patient is eager to get this done, we will attempt left ureteroscopy and right percutaneous nephrolithotomy in the same anesthesia. I discussed the risks and the benefits of this procedure and in this combination. Patient understands that there is a significant risk of perinephric hematoma/bleeding following percutaneous nephrolithotomy. In addition the patient is well aware that he will need stents bilaterally if we do do both sides. I will plan not to leave a string and a stent will be removed in the office.

## 2013-08-08 ENCOUNTER — Encounter (HOSPITAL_BASED_OUTPATIENT_CLINIC_OR_DEPARTMENT_OTHER): Payer: Self-pay | Admitting: Urology

## 2013-08-11 ENCOUNTER — Inpatient Hospital Stay (HOSPITAL_COMMUNITY)
Admission: EM | Admit: 2013-08-11 | Discharge: 2013-08-20 | DRG: 871 | Disposition: A | Discharge status: Home / Self Care | Payer: BC Managed Care – PPO | Attending: Internal Medicine | Admitting: Internal Medicine

## 2013-08-11 ENCOUNTER — Encounter (HOSPITAL_COMMUNITY): Payer: Self-pay | Admitting: Emergency Medicine

## 2013-08-11 ENCOUNTER — Emergency Department (HOSPITAL_COMMUNITY): Payer: BC Managed Care – PPO

## 2013-08-11 DIAGNOSIS — G4733 Obstructive sleep apnea (adult) (pediatric): Secondary | ICD-10-CM | POA: Diagnosis present

## 2013-08-11 DIAGNOSIS — N200 Calculus of kidney: Secondary | ICD-10-CM | POA: Diagnosis present

## 2013-08-11 DIAGNOSIS — N1330 Unspecified hydronephrosis: Secondary | ICD-10-CM | POA: Diagnosis present

## 2013-08-11 DIAGNOSIS — M545 Low back pain, unspecified: Secondary | ICD-10-CM | POA: Diagnosis present

## 2013-08-11 DIAGNOSIS — R5381 Other malaise: Secondary | ICD-10-CM | POA: Diagnosis present

## 2013-08-11 DIAGNOSIS — R5383 Other fatigue: Secondary | ICD-10-CM

## 2013-08-11 DIAGNOSIS — R6520 Severe sepsis without septic shock: Secondary | ICD-10-CM

## 2013-08-11 DIAGNOSIS — D72829 Elevated white blood cell count, unspecified: Secondary | ICD-10-CM

## 2013-08-11 DIAGNOSIS — R918 Other nonspecific abnormal finding of lung field: Secondary | ICD-10-CM | POA: Diagnosis present

## 2013-08-11 DIAGNOSIS — E876 Hypokalemia: Secondary | ICD-10-CM | POA: Diagnosis present

## 2013-08-11 DIAGNOSIS — E785 Hyperlipidemia, unspecified: Secondary | ICD-10-CM | POA: Diagnosis present

## 2013-08-11 DIAGNOSIS — R040 Epistaxis: Secondary | ICD-10-CM | POA: Diagnosis not present

## 2013-08-11 DIAGNOSIS — R3915 Urgency of urination: Secondary | ICD-10-CM | POA: Diagnosis present

## 2013-08-11 DIAGNOSIS — M79609 Pain in unspecified limb: Secondary | ICD-10-CM | POA: Diagnosis present

## 2013-08-11 DIAGNOSIS — Z87891 Personal history of nicotine dependence: Secondary | ICD-10-CM

## 2013-08-11 DIAGNOSIS — B952 Enterococcus as the cause of diseases classified elsewhere: Secondary | ICD-10-CM

## 2013-08-11 DIAGNOSIS — F329 Major depressive disorder, single episode, unspecified: Secondary | ICD-10-CM | POA: Diagnosis present

## 2013-08-11 DIAGNOSIS — I10 Essential (primary) hypertension: Secondary | ICD-10-CM | POA: Diagnosis present

## 2013-08-11 DIAGNOSIS — A689 Relapsing fever, unspecified: Secondary | ICD-10-CM

## 2013-08-11 DIAGNOSIS — I5033 Acute on chronic diastolic (congestive) heart failure: Secondary | ICD-10-CM | POA: Diagnosis present

## 2013-08-11 DIAGNOSIS — N10 Acute tubulo-interstitial nephritis: Secondary | ICD-10-CM

## 2013-08-11 DIAGNOSIS — Z880 Allergy status to penicillin: Secondary | ICD-10-CM

## 2013-08-11 DIAGNOSIS — F3289 Other specified depressive episodes: Secondary | ICD-10-CM | POA: Diagnosis present

## 2013-08-11 DIAGNOSIS — K219 Gastro-esophageal reflux disease without esophagitis: Secondary | ICD-10-CM | POA: Diagnosis present

## 2013-08-11 DIAGNOSIS — J9600 Acute respiratory failure, unspecified whether with hypoxia or hypercapnia: Secondary | ICD-10-CM | POA: Diagnosis present

## 2013-08-11 DIAGNOSIS — Z87442 Personal history of urinary calculi: Secondary | ICD-10-CM

## 2013-08-11 DIAGNOSIS — K5900 Constipation, unspecified: Secondary | ICD-10-CM | POA: Diagnosis present

## 2013-08-11 DIAGNOSIS — A419 Sepsis, unspecified organism: Principal | ICD-10-CM | POA: Diagnosis present

## 2013-08-11 DIAGNOSIS — IMO0001 Reserved for inherently not codable concepts without codable children: Secondary | ICD-10-CM | POA: Diagnosis present

## 2013-08-11 DIAGNOSIS — N179 Acute kidney failure, unspecified: Secondary | ICD-10-CM | POA: Diagnosis present

## 2013-08-11 DIAGNOSIS — N390 Urinary tract infection, site not specified: Secondary | ICD-10-CM

## 2013-08-11 DIAGNOSIS — G4485 Primary stabbing headache: Secondary | ICD-10-CM

## 2013-08-11 DIAGNOSIS — J9601 Acute respiratory failure with hypoxia: Secondary | ICD-10-CM

## 2013-08-11 DIAGNOSIS — J9819 Other pulmonary collapse: Secondary | ICD-10-CM | POA: Diagnosis present

## 2013-08-11 LAB — CBC WITH DIFFERENTIAL/PLATELET
BASOS PCT: 0 % (ref 0–1)
Basophils Absolute: 0 10*3/uL (ref 0.0–0.1)
EOS PCT: 0 % (ref 0–5)
Eosinophils Absolute: 0 10*3/uL (ref 0.0–0.7)
HCT: 43 % (ref 39.0–52.0)
Hemoglobin: 15.1 g/dL (ref 13.0–17.0)
LYMPHS ABS: 0.9 10*3/uL (ref 0.7–4.0)
Lymphocytes Relative: 3 % — ABNORMAL LOW (ref 12–46)
MCH: 31.1 pg (ref 26.0–34.0)
MCHC: 35.1 g/dL (ref 30.0–36.0)
MCV: 88.5 fL (ref 78.0–100.0)
MONO ABS: 2.7 10*3/uL — AB (ref 0.1–1.0)
Monocytes Relative: 9 % (ref 3–12)
Neutro Abs: 26.1 10*3/uL — ABNORMAL HIGH (ref 1.7–7.7)
Neutrophils Relative %: 88 % — ABNORMAL HIGH (ref 43–77)
PLATELETS: 251 10*3/uL (ref 150–400)
RBC: 4.86 MIL/uL (ref 4.22–5.81)
RDW: 14.1 % (ref 11.5–15.5)
WBC: 29.7 10*3/uL — ABNORMAL HIGH (ref 4.0–10.5)

## 2013-08-11 LAB — BASIC METABOLIC PANEL
BUN: 23 mg/dL (ref 6–23)
CO2: 23 mEq/L (ref 19–32)
CREATININE: 1.68 mg/dL — AB (ref 0.50–1.35)
Calcium: 8.5 mg/dL (ref 8.4–10.5)
Chloride: 99 mEq/L (ref 96–112)
GFR calc non Af Amer: 44 mL/min — ABNORMAL LOW (ref >90)
GFR, EST AFRICAN AMERICAN: 51 mL/min — AB (ref >90)
Glucose, Bld: 150 mg/dL — ABNORMAL HIGH (ref 70–99)
Potassium: 4.5 mEq/L (ref 3.7–5.3)
Sodium: 134 mEq/L — ABNORMAL LOW (ref 137–147)

## 2013-08-11 LAB — I-STAT CHEM 8, ED
BUN: 23 mg/dL (ref 6–23)
CALCIUM ION: 1.14 mmol/L (ref 1.12–1.23)
CHLORIDE: 98 meq/L (ref 96–112)
Creatinine, Ser: 2 mg/dL — ABNORMAL HIGH (ref 0.50–1.35)
GLUCOSE: 189 mg/dL — AB (ref 70–99)
HCT: 49 % (ref 39.0–52.0)
Hemoglobin: 16.7 g/dL (ref 13.0–17.0)
Potassium: 3 mEq/L — ABNORMAL LOW (ref 3.7–5.3)
Sodium: 135 mEq/L — ABNORMAL LOW (ref 137–147)
TCO2: 20 mmol/L (ref 0–100)

## 2013-08-11 LAB — COMPREHENSIVE METABOLIC PANEL
ALT: 43 U/L (ref 0–53)
AST: 36 U/L (ref 0–37)
Albumin: 3.2 g/dL — ABNORMAL LOW (ref 3.5–5.2)
Alkaline Phosphatase: 167 U/L — ABNORMAL HIGH (ref 39–117)
BUN: 24 mg/dL — AB (ref 6–23)
CALCIUM: 9.4 mg/dL (ref 8.4–10.5)
CO2: 20 meq/L (ref 19–32)
CREATININE: 1.88 mg/dL — AB (ref 0.50–1.35)
Chloride: 92 mEq/L — ABNORMAL LOW (ref 96–112)
GFR calc Af Amer: 44 mL/min — ABNORMAL LOW (ref >90)
GFR, EST NON AFRICAN AMERICAN: 38 mL/min — AB (ref >90)
GLUCOSE: 193 mg/dL — AB (ref 70–99)
Potassium: 3 mEq/L — ABNORMAL LOW (ref 3.7–5.3)
Sodium: 130 mEq/L — ABNORMAL LOW (ref 137–147)
Total Bilirubin: 1.1 mg/dL (ref 0.3–1.2)
Total Protein: 7.7 g/dL (ref 6.0–8.3)

## 2013-08-11 LAB — URINALYSIS, ROUTINE W REFLEX MICROSCOPIC
Glucose, UA: NEGATIVE mg/dL
Ketones, ur: NEGATIVE mg/dL
NITRITE: NEGATIVE
Protein, ur: 30 mg/dL — AB
SPECIFIC GRAVITY, URINE: 1.025 (ref 1.005–1.030)
UROBILINOGEN UA: 1 mg/dL (ref 0.0–1.0)
pH: 5.5 (ref 5.0–8.0)

## 2013-08-11 LAB — URINE MICROSCOPIC-ADD ON

## 2013-08-11 LAB — I-STAT CG4 LACTIC ACID, ED: LACTIC ACID, VENOUS: 4.7 mmol/L — AB (ref 0.5–2.2)

## 2013-08-11 LAB — PHOSPHORUS: Phosphorus: 2.3 mg/dL (ref 2.3–4.6)

## 2013-08-11 LAB — MAGNESIUM: MAGNESIUM: 1.6 mg/dL (ref 1.5–2.5)

## 2013-08-11 LAB — LACTIC ACID, PLASMA: LACTIC ACID, VENOUS: 4.2 mmol/L — AB (ref 0.5–2.2)

## 2013-08-11 LAB — MRSA PCR SCREENING: MRSA by PCR: NEGATIVE

## 2013-08-11 MED ORDER — CITALOPRAM HYDROBROMIDE 40 MG PO TABS
40.0000 mg | ORAL_TABLET | Freq: Every morning | ORAL | Status: DC
  Administered 2013-08-12 – 2013-08-20 (×9): 40 mg via ORAL
  Filled 2013-08-11 (×9): qty 1

## 2013-08-11 MED ORDER — VANCOMYCIN HCL 10 G IV SOLR
1500.0000 mg | INTRAVENOUS | Status: DC
  Administered 2013-08-12 – 2013-08-13 (×2): 1500 mg via INTRAVENOUS
  Filled 2013-08-11 (×2): qty 1500

## 2013-08-11 MED ORDER — VANCOMYCIN HCL IN DEXTROSE 1-5 GM/200ML-% IV SOLN
1000.0000 mg | INTRAVENOUS | Status: AC
  Administered 2013-08-11: 1000 mg via INTRAVENOUS
  Filled 2013-08-11: qty 200

## 2013-08-11 MED ORDER — ONDANSETRON HCL 4 MG PO TABS: 4.0000 mg | ORAL_TABLET | Freq: Four times a day (QID) | ORAL | Status: DC | PRN

## 2013-08-11 MED ORDER — SODIUM CHLORIDE 0.9 % IV BOLUS (SEPSIS)
1000.0000 mL | Freq: Once | INTRAVENOUS | Status: AC
  Administered 2013-08-11: 1000 mL via INTRAVENOUS

## 2013-08-11 MED ORDER — VANCOMYCIN HCL IN DEXTROSE 1-5 GM/200ML-% IV SOLN: 1000.0000 mg | Freq: Once | INTRAVENOUS | Status: DC

## 2013-08-11 MED ORDER — HYDROMORPHONE HCL PF 1 MG/ML IJ SOLN
1.0000 mg | INTRAMUSCULAR | Status: DC | PRN
  Administered 2013-08-11 – 2013-08-14 (×18): 1 mg via INTRAVENOUS
  Filled 2013-08-11 (×18): qty 1

## 2013-08-11 MED ORDER — SODIUM CHLORIDE 0.9 % IV SOLN
1000.0000 mL | Freq: Once | INTRAVENOUS | Status: AC
  Administered 2013-08-11: 1000 mL via INTRAVENOUS

## 2013-08-11 MED ORDER — DEXTROSE 5 % IV SOLN
2.0000 g | INTRAVENOUS | Status: AC
  Administered 2013-08-11: 2 g via INTRAVENOUS

## 2013-08-11 MED ORDER — ONDANSETRON HCL 4 MG/2ML IJ SOLN
4.0000 mg | Freq: Four times a day (QID) | INTRAMUSCULAR | Status: DC | PRN
  Administered 2013-08-11 – 2013-08-19 (×5): 4 mg via INTRAVENOUS
  Filled 2013-08-11 (×5): qty 2

## 2013-08-11 MED ORDER — ENOXAPARIN SODIUM 30 MG/0.3ML ~~LOC~~ SOLN: 30.0000 mg | SUBCUTANEOUS | Status: DC

## 2013-08-11 MED ORDER — SODIUM CHLORIDE 0.9 % IV SOLN
1000.0000 mL | INTRAVENOUS | Status: DC
  Administered 2013-08-11: 1000 mL via INTRAVENOUS

## 2013-08-11 MED ORDER — SIMVASTATIN 40 MG PO TABS
40.0000 mg | ORAL_TABLET | Freq: Every day | ORAL | Status: DC
Start: 2013-08-11 — End: 2013-08-20
  Administered 2013-08-11 – 2013-08-19 (×8): 40 mg via ORAL
  Filled 2013-08-11 (×10): qty 1

## 2013-08-11 MED ORDER — DOXYLAMINE SUCCINATE (SLEEP) 25 MG PO TABS: 25.0000 mg | ORAL_TABLET | Freq: Every evening | ORAL | Status: DC | PRN

## 2013-08-11 MED ORDER — CEFEPIME HCL 2 G IJ SOLR
2.0000 g | Freq: Two times a day (BID) | INTRAMUSCULAR | Status: DC
  Administered 2013-08-12 – 2013-08-14 (×6): 2 g via INTRAVENOUS
  Filled 2013-08-11 (×6): qty 2

## 2013-08-11 MED ORDER — SODIUM CHLORIDE 0.9 % IV SOLN
INTRAVENOUS | Status: DC
  Administered 2013-08-13 – 2013-08-19 (×4): via INTRAVENOUS

## 2013-08-11 MED ORDER — PANTOPRAZOLE SODIUM 40 MG PO TBEC
40.0000 mg | DELAYED_RELEASE_TABLET | Freq: Every day | ORAL | Status: DC
  Administered 2013-08-11 – 2013-08-20 (×10): 40 mg via ORAL
  Filled 2013-08-11 (×10): qty 1

## 2013-08-11 MED ORDER — MEPERIDINE HCL 25 MG/ML IJ SOLN
25.0000 mg | Freq: Once | INTRAMUSCULAR | Status: AC
  Administered 2013-08-11: 25 mg via INTRAVENOUS
  Filled 2013-08-11: qty 1

## 2013-08-11 MED ORDER — DOCUSATE SODIUM 100 MG PO CAPS
100.0000 mg | ORAL_CAPSULE | Freq: Two times a day (BID) | ORAL | Status: DC | PRN
  Administered 2013-08-15: 100 mg via ORAL
  Filled 2013-08-11 (×2): qty 1

## 2013-08-11 MED ORDER — ENOXAPARIN SODIUM 60 MG/0.6ML ~~LOC~~ SOLN
60.0000 mg | Freq: Every day | SUBCUTANEOUS | Status: DC
  Administered 2013-08-11 – 2013-08-19 (×9): 60 mg via SUBCUTANEOUS
  Filled 2013-08-11 (×10): qty 0.6

## 2013-08-11 NOTE — ED Notes (Signed)
Palmer, PA to bedside to update pt on status

## 2013-08-11 NOTE — ED Notes (Signed)
Pt sent here from [facility] for possible uro sepsis. Pt had stent placement 10 days ago and been having fevers for two days, body aches and pains, tachycardia per [facility] staff.

## 2013-08-11 NOTE — ED Notes (Signed)
Pt sent by [facility].  Pt had stent placed x 10 days ago.  Pt reports fevers and was tachycardic in 120s.  MDs office would like Pt to be evaluated for Urosepsis.

## 2013-08-11 NOTE — H&P (Signed)
[facility] History and Physical  Marcie MowersKevan E Ahuja ZOX:[id number] DOB: 1956/01/19 DOA: 08/11/2013  Referring physician: ED physician PCP: Loleta DickerJUDGE,ERIN, FNP   Chief Complaint: generalized weakness, fever  HPI:  58 y.o. male with a PMH of HTN, HLD, fibromyalgia, GERD, hx of kidney stones, depression, bilateral leg pain, OSA, and hepatitis who presents to the ED with main concern of sudden onset of fever of 101 - 102 F, lethargy, poor oral intake. Pt is unable to provide detailed history as he is rather somnolent. Wife at bedside explains that pt had right ureteral stent removal and left laser lithotripsy with stone extraction done 08/05/2013 by Dr. Marlou PorchHerrick. He went home and was initially doing well but 2 days piror to this admission he started to feel tired, has not been eating or drinking fluids, lightheaded and weak. This has been associated with worsening and constant lower back pain, throbbing and 10/10 in severity, non radiating, no specific alleviating or aggravating factors. Pt was seen in Dr. Jasmine AweHerrick's clinic, found to be hypotensive and tachycardic, he was sent to ED for further evaluation and [facility] asked to admit.  Assessment and Plan: Active Problems: SIRS/Sepsis - of unclear etiology, pt with fever, tachycardia, hypotension, elevated lactic acid - will admit to SDU - place on IVF - obtain UA, urine and blood culture   - place on broad spectrum ABX Vancomycin and Maxipime  Right hydronephrosis - management per urology team - monitor urine output  Hypotension - continue IVF, hold Metoprolol, HCTZ Hypokalemia - supplement with K-dur and check Mg - repeat BMP In AM Acute renal failure  - continue IVF, ABX  - hold Mobic and other NSAID's for now  Leukocytosis  - unclear etiology - UA, urine culture and blood culture pending  Right peritracheal prominence - CT chest with contrast will have to wait due to acute renal failure but can be obtained in an outpatient setting    Radiological Exams on Admission: Ct Abdomen Pelvis Wo Contrast  08/11/2013   Bilateral renal calculi.  Left ureteral stent without left hydronephrosis.  Mild right hydronephrosis and hydroureter.  Question minimally nodular hepatic margins, cannot exclude cirrhosis.   Dg Chest Port 1 View  08/11/2013   Right peritracheal prominence is seen concerning for possible neoplasm or adenopathy; CT scan of the chest with contrast administration is recommended.    Code Status: Full Family Communication: Pt and wife at bedside Disposition Plan: Admit to SDU   Review of Systems:  Constitutional: Negative for diaphoresis.  HENT: Negative for hearing loss, ear pain, nosebleeds, congestion, sore throat, neck pain, tinnitus and ear discharge.   Eyes: Negative for blurred vision, double vision, photophobia, pain, discharge and redness.  Respiratory: Negative for shortness of breath, wheezing and stridor.   Cardiovascular: Negative for chest pain, palpitations, orthopnea, claudication and leg swelling.  Gastrointestinal: Negative for heartburn, constipation, blood in stool and melena.  Genitourinary: Positive for decreased urine, feeling of urgency  Musculoskeletal: Negative for myalgias, back pain, joint pain and falls.  Skin: Negative for itching and rash.  Neurological: Negative for tingling, tremors, sensory change, speech change, focal weakness, loss of consciousness and headaches.  Endo/Heme/Allergies: Negative for environmental allergies and polydipsia. Does not bruise/bleed easily.  Psychiatric/Behavioral: Negative for suicidal ideas. The patient is not nervous/anxious.      Past Medical History  Diagnosis Date  . Hypertension   . Hyperlipidemia   . Fibromyalgia   . GERD (gastroesophageal reflux disease)   . History of kidney stones  LONG HX AND CURRENTLY PASSED STONES ALL THE TIME  . Depression   . Leg pain, bilateral     aching of both legs - chronic  . OSA on CPAP   . History of  hepatitis     CHILDHOOD-- UNKNOWN TYPE  . Renal calculus, left     MULTIPLE  . Wears contact lenses     Past Surgical History  Procedure Laterality Date  . Cystoscopy with retrograde pyelogram, ureteroscopy and stent placement Bilateral 07/22/2013    Procedure: CYSTOSCOPY WITH LEFT AND RIGHT  URETEROSCOPY ,HOLMIUM LASER LITHTRIPSY,  STENT PLACEMENT, RIGHT AND LEFT  RETROGRAM PYELOGRAM ;  Surgeon: Crist Fat, MD;  Location: [facility];  Service: Urology;  Laterality: Bilateral;  . Nephrolithotomy Right 07/22/2013    Procedure: RIGHT PERCUTANEOUS NEPHROLITHOTOMY SURGEON ACCESS ;  Surgeon: Crist Fat, MD;  Location: [facility];  Service: Urology;  Laterality: Right;  . Holmium laser application Bilateral 07/22/2013    Procedure: HOLMIUM LASER APPLICATION;  Surgeon: Crist Fat, MD;  Location: [facility];  Service: Urology;  Laterality: Bilateral;  . Right ureteroscopic stone extraction  02-17-2000  . Percutaneous nephrostolithotomy Right 06-02-2008  . Extracorporeal shock wave lithotripsy  MULTIPLE  . Multiple cysto/ ureteoscopic stone extractions  PRIOR TO 2001  . Appendectomy  1970's  . Cholecystectomy  2001  . Cystoscopy with retrograde pyelogram, ureteroscopy and stent placement Bilateral 08/05/2013    Procedure: BILATERAL URETEROSCOPY WITH BILATERAL RETROGRADE PYELOGRAM, RIGHT STENT REMOVAL, LEFT LASER LITHOTRIPSY  AND LEFT URETEDRAL STENT EXCHANGE;  Surgeon: Crist Fat, MD;  Location: [facility];  Service: Urology;  Laterality: Bilateral;  . Holmium laser application Left 08/05/2013    Procedure: HOLMIUM LASER APPLICATION;  Surgeon: Crist Fat, MD;  Location: [facility];  Service: Urology;  Laterality: Left;    Social History:  reports that he quit smoking about 10 years ago. His smoking use included Cigarettes. He has a 80 pack-year smoking history. He has never used smokeless tobacco. He reports that he does not drink alcohol or  use illicit drugs.  Allergies  Allergen Reactions  . Lyrica [Pregabalin] Swelling  . Penicillins Rash    No specific family medical history   Prior to Admission medications   Medication Sig Start Date End Date Taking? Authorizing Provider  amitriptyline (ELAVIL) 25 MG tablet Take 50-75 mg by mouth at bedtime as needed for sleep.    Yes Historical Provider, MD  anti-nausea (EMETROL) solution Take 10 mLs by mouth every 15 (fifteen) minutes as needed for nausea or vomiting.   Yes Historical Provider, MD  bismuth subsalicylate (PEPTO BISMOL) 262 MG/15ML suspension Take 30 mLs by mouth every 6 (six) hours as needed for diarrhea or loose stools (nausea).   Yes Historical Provider, MD  cetirizine (ZYRTEC) 10 MG tablet Take 10 mg by mouth every morning.   Yes Historical Provider, MD  citalopram (CELEXA) 40 MG tablet Take 40 mg by mouth every morning.   Yes Historical Provider, MD  cyanocobalamin (,VITAMIN B-12,) 1000 MCG/ML injection Inject 1,000 mcg into the muscle every 30 (thirty) days.   Yes Historical Provider, MD  docusate sodium (COLACE) 100 MG capsule Take 1 capsule (100 mg total) by mouth 2 (two) times daily as needed (take to keep stool soft.). 07/23/13  Yes Crist Fat, MD  doxylamine, Sleep, (KLS SLEEP AID) 25 MG tablet Take 25 mg by mouth at bedtime as needed for sleep.    Yes Historical Provider, MD  hydrochlorothiazide (HYDRODIURIL) 12.5 MG tablet Take 12.5 mg by mouth every morning.    Yes Historical Provider, MD  Melatonin 3 MG TABS Take 3 mg by mouth at bedtime.   Yes Historical Provider, MD  metoprolol succinate (TOPROL-XL) 25 MG 24 hr tablet Take 50 mg by mouth at bedtime.   Yes Historical Provider, MD  Multiple Vitamin (MULTIVITAMIN WITH MINERALS) TABS tablet Take 1 tablet by mouth every morning.   Yes Historical Provider, MD  omeprazole (PRILOSEC) 20 MG capsule Take 20 mg by mouth every morning.   Yes Historical Provider, MD  oxyCODONE (ROXICODONE) 15 MG immediate release  tablet Take 1 tablet (15 mg total) by mouth every 3 (three) hours as needed for pain. 07/23/13  Yes Crist Fat, MD  Polyethylene Glycol 3350 (MIRALAX PO) Take 17 g by mouth daily as needed (constipation).    Yes Historical Provider, MD  simvastatin (ZOCOR) 40 MG tablet Take 40 mg by mouth at bedtime.   Yes Historical Provider, MD  testosterone cypionate (DEPOTESTOTERONE CYPIONATE) 200 MG/ML injection Inject 200 mg into the muscle every 14 (fourteen) days.   Yes Historical Provider, MD  ciprofloxacin (CIPRO) 500 MG tablet Take 1 tablet (500 mg total) by mouth 2 (two) times daily. 08/05/13   Crist Fat, MD  meloxicam (MOBIC) 15 MG tablet Take 1 tablet (15 mg total) by mouth at bedtime. 07/23/13   Crist Fat, MD    Physical Exam: Filed Vitals:   08/11/13 1327 08/11/13 1344 08/11/13 1530 08/11/13 1545  BP: 70/53 94/54 91/66  100/65  Pulse: 99 94 89 85  Temp: 98 F (36.7 C) 98.6 F (37 C)    TempSrc: Oral Oral    Resp: 22 22 26 16   SpO2: 99% 97% 96% 97%    Physical Exam  Constitutional: Appears tired, somnolent but NAD HENT: Normocephalic. External right and left ear normal. Dry MM  Eyes: Conjunctivae and EOM are normal. PERRLA, no scleral icterus.  Neck: Normal ROM. Neck supple. No JVD. No tracheal deviation. No thyromegaly.  CVS: RRR, S1/S2 +, no murmurs, no gallops, no carotid bruit.  Pulmonary: Effort and breath sounds normal, no stridor, rhonchi, diminished breath sounds at base  Abdominal: Soft. BS +,  no distension, tenderness in epigastric area, no rebound or guarding.  Musculoskeletal: Normal range of motion. No edema and no tenderness.  Lymphadenopathy: No lymphadenopathy noted, cervical, inguinal. Neuro: Somnolent but easy to arouse. Normal reflexes, muscle tone coordination. No cranial nerve deficit. Skin: Skin is warm and dry. No rash noted. Not diaphoretic. No erythema. No pallor.  Psychiatric: Unable to assess as pt is rather somnolent   Labs on  Admission:  Basic Metabolic Panel:  Recent Labs Lab 08/11/13 1341 08/11/13 1352  NA 130* 135*  K 3.0* 3.0*  CL 92* 98  CO2 20  --   GLUCOSE 193* 189*  BUN 24* 23  CREATININE 1.88* 2.00*  CALCIUM 9.4  --    Liver Function Tests:  Recent Labs Lab 08/11/13 1341  AST 36  ALT 43  ALKPHOS 167*  BILITOT 1.1  PROT 7.7  ALBUMIN 3.2*   CBC:  Recent Labs Lab 08/11/13 1341 08/11/13 1352  WBC 29.7*  --   NEUTROABS 26.1*  --   HGB 15.1 16.7  HCT 43.0 49.0  MCV 88.5  --   PLT 251  --    EKG: Normal sinus rhythm, no ST/T wave changes  Debbora Presto, MD  [facility] Pager [phone]  If 7PM-7AM, please contact  night-coverage [email or web address] Password [facility] 08/11/2013, 4:39 PM

## 2013-08-11 NOTE — ED Provider Notes (Addendum)
Medical screening examination/treatment/procedure(s) were conducted as a shared visit with non-physician practitioner(s) and myself.  I personally evaluated the patient during the encounter.  EKG Interpretation    Date/Time:  Thursday August 11 2013 13:17:52 EST Ventricular Rate:  108 PR Interval:  161 QRS Duration: 101 QT Interval:  352 QTC Calculation: 472 R Axis:   7 Text Interpretation:  Sinus tachycardia Borderline T abnormalities, anterior leads Confirmed by WARD  DO, KRISTEN ([id number]) on 08/11/2013 1:23:16 PM            1:00 PM  Patient is a 58 year old male with a history of kidney stones, hypertension, hyperlipidemia who recently underwent Cystoscopy, right retrograde pyelogram with interpretation, right ureteroscopy, right ureteral stent removal and Left retrograde pyelogram, left ureteroscopy, left laser lithotripsy stone extraction, left ureteral stent exchange with Crist FatBenjamin W. Herrick, MD on 08/05/13 who presents emergency department with fever, vomiting and diarrhea, diffuse abdominal pain, decreased by mouth intake, hypotension the past several days. On exam, patient is tachycardic, hypotensive. He is mentating but does have episodes of falling asleep during exam. His abdomen is mildly tender to palpation diffusely but not peritoneal. Will give broad-spectrum antibiotics, IV fluids, obtain cultures, chest x-ray, urine, CT of his abdomen and pelvis. Patient will need admission.  2:00 PM  BP improving with IVF.  Patient has leukocytosis of 29.7. Lactate is also elevated at 4.7. Urine and CT scan pending.  Mentation is improving.  3:43 PM  Pt's CT scan shows renal calculi, left ureteral stent with left hydronephrosis and mild right hydronephrosis and hydroureter. No other pathology noted on CT scan. Urine pending. His systolic blood pressure stable in the 90s. We'll give third liter IV fluids. Will discuss with critical care for admission.  4:00 PM  Pt has now had multiple SBP  readings in 100s.  Will d/w hospitalist for admission to stepdown.   CRITICAL CARE Performed by: Raelyn NumberWARD, KRISTEN N   Total critical care time: 45 minutes  Critical care time was exclusive of separately billable procedures and treating other patients.  Critical care was necessary to treat or prevent imminent or life-threatening deterioration.  Critical care was time spent personally by me on the following activities: development of treatment plan with patient and/or surrogate as well as nursing, discussions with consultants, evaluation of patient's response to treatment, examination of patient, obtaining history from patient or surrogate, ordering and performing treatments and interventions, ordering and review of laboratory studies, ordering and review of radiographic studies, pulse oximetry and re-evaluation of patient's condition.  in the to improve his earlier in is  Raelyn NumberKristen N Ward, DO 08/11/13 1544  Layla MawKristen N Ward, DO 08/11/13 1553

## 2013-08-11 NOTE — ED Provider Notes (Signed)
CSN: [id number]     Arrival date & time 08/11/13  1307 History   First MD Initiated Contact with Patient 08/11/13 1313     Chief Complaint  Patient presents with  . Fever  . Tachycardia  . Dizziness  . Back Pain    HPI  Edward [name] is a 58 y.o. male with a PMH of HTN, HLD, fibromyalgia, GERD, hx of kidney stones, depression, bilateral leg pain, OSA, and hepatitis who presents to the ED for evaluation of fever, tachycardia, dizziness and back pain.  History was provided by the patient and his wife. Patient alert and oriented x 2. Lethargic. History limited due to patient's condition. Patient had a right ureteral stent removal and left laser lithotripsy with stone extraction and left ureteral stent exchange performed by Dr. Marlou Porch with urology on 08/05/13. He went home and was doing well until two days ago when the patient "did not feel well." Symptoms include fatigue, nausea, decreased appetite, emesis, diarrhea, lightheadedness, lower back pain, fever, and abdominal pain. Patient's temp at home was 101.6 axillary. Lower back pain is diffuse. Patient also has generalized abdominal bloating. Patient has had no difficulty urinating but his urine has been decreased. No dysuria. Wife called Dr. Jasmine Awe office and patient seen in clinic. Patient tachycardic and hypotensive in clinic and sent to the ED for further evaluation.    Past Medical History  Diagnosis Date  . Hypertension   . Hyperlipidemia   . Fibromyalgia   . GERD (gastroesophageal reflux disease)   . History of kidney stones     LONG HX AND CURRENTLY PASSED STONES ALL THE TIME  . Depression   . Leg pain, bilateral     aching of both legs - chronic  . OSA on CPAP   . History of hepatitis     CHILDHOOD-- UNKNOWN TYPE  . Renal calculus, left     MULTIPLE  . Wears contact lenses    Past Surgical History  Procedure Laterality Date  . Cystoscopy with retrograde pyelogram, ureteroscopy and stent placement Bilateral 07/22/2013     Procedure: CYSTOSCOPY WITH LEFT AND RIGHT  URETEROSCOPY ,HOLMIUM LASER LITHTRIPSY,  STENT PLACEMENT, RIGHT AND LEFT  RETROGRAM PYELOGRAM ;  Surgeon: Crist Fat, MD;  Location: [facility];  Service: Urology;  Laterality: Bilateral;  . Nephrolithotomy Right 07/22/2013    Procedure: RIGHT PERCUTANEOUS NEPHROLITHOTOMY SURGEON ACCESS ;  Surgeon: Crist Fat, MD;  Location: [facility];  Service: Urology;  Laterality: Right;  . Holmium laser application Bilateral 07/22/2013    Procedure: HOLMIUM LASER APPLICATION;  Surgeon: Crist Fat, MD;  Location: [facility];  Service: Urology;  Laterality: Bilateral;  . Right ureteroscopic stone extraction  02-17-2000  . Percutaneous nephrostolithotomy Right 06-02-2008  . Extracorporeal shock wave lithotripsy  MULTIPLE  . Multiple cysto/ ureteoscopic stone extractions  PRIOR TO 2001  . Appendectomy  1970's  . Cholecystectomy  2001  . Cystoscopy with retrograde pyelogram, ureteroscopy and stent placement Bilateral 08/05/2013    Procedure: BILATERAL URETEROSCOPY WITH BILATERAL RETROGRADE PYELOGRAM, RIGHT STENT REMOVAL, LEFT LASER LITHOTRIPSY  AND LEFT URETEDRAL STENT EXCHANGE;  Surgeon: Crist Fat, MD;  Location: [facility];  Service: Urology;  Laterality: Bilateral;  . Holmium laser application Left 08/05/2013    Procedure: HOLMIUM LASER APPLICATION;  Surgeon: Crist Fat, MD;  Location: [facility];  Service: Urology;  Laterality: Left;   No family history on file. History  Substance Use Topics  . Smoking status: Former  Smoker -- 2.00 packs/day for 40 years    Types: Cigarettes    Quit date: 07/19/2003  . Smokeless tobacco: Never Used  . Alcohol Use: No    Review of Systems  Constitutional: Positive for fever, chills, activity change, appetite change and fatigue.  HENT: Negative for congestion and sore throat.   Respiratory: Negative for cough and shortness of breath.   Cardiovascular: Negative for chest  pain and leg swelling.  Gastrointestinal: Positive for nausea, vomiting, abdominal pain and diarrhea. Negative for constipation and blood in stool.  Genitourinary: Positive for decreased urine volume. Negative for dysuria, hematuria, difficulty urinating and penile pain.  Musculoskeletal: Positive for back pain. Negative for myalgias.  Skin: Negative for rash and wound.  Neurological: Positive for weakness (generalized) and light-headedness. Negative for dizziness and headaches.    Allergies  Lyrica and Penicillins  Home Medications   Current Outpatient Rx  Name  Route  Sig  Dispense  Refill  . amitriptyline (ELAVIL) 25 MG tablet   Oral   Take 50-75 mg by mouth at bedtime as needed for sleep.          . cetirizine (ZYRTEC) 10 MG tablet   Oral   Take 10 mg by mouth every morning.         . ciprofloxacin (CIPRO) 500 MG tablet   Oral   Take 1 tablet (500 mg total) by mouth 2 (two) times daily.   1 tablet   0     Take one hour prior to stent   . citalopram (CELEXA) 40 MG tablet   Oral   Take 40 mg by mouth every morning.         . cyanocobalamin (,VITAMIN B-12,) 1000 MCG/ML injection   Intramuscular   Inject 1,000 mcg into the muscle every 30 (thirty) days.         Marland Kitchen docusate sodium (COLACE) 100 MG capsule   Oral   Take 1 capsule (100 mg total) by mouth 2 (two) times daily as needed (take to keep stool soft.).   60 capsule   0   . doxylamine, Sleep, (KLS SLEEP AID) 25 MG tablet   Oral   Take 25 mg by mouth at bedtime as needed for sleep.          . hydrochlorothiazide (HYDRODIURIL) 12.5 MG tablet   Oral   Take 12.5 mg by mouth every morning.          . Melatonin 3 MG TABS   Oral   Take 3 mg by mouth at bedtime.         . meloxicam (MOBIC) 15 MG tablet   Oral   Take 1 tablet (15 mg total) by mouth at bedtime.           Resume in 7 days.   . metoprolol succinate (TOPROL-XL) 25 MG 24 hr tablet   Oral   Take 50 mg by mouth at bedtime.          . Multiple Vitamin (MULTIVITAMIN WITH MINERALS) TABS tablet   Oral   Take 1 tablet by mouth every morning.         Marland Kitchen omeprazole (PRILOSEC) 20 MG capsule   Oral   Take 20 mg by mouth every morning.         Marland Kitchen oxyCODONE (ROXICODONE) 15 MG immediate release tablet   Oral   Take 1 tablet (15 mg total) by mouth every 3 (three) hours as needed for pain.   30 tablet  0   . Polyethylene Glycol 3350 (MIRALAX PO)   Oral   Take 17 g by mouth at bedtime.         . simvastatin (ZOCOR) 40 MG tablet   Oral   Take 40 mg by mouth at bedtime.         Marland Kitchen. testosterone cypionate (DEPOTESTOTERONE CYPIONATE) 200 MG/ML injection   Intramuscular   Inject 200 mg into the muscle every 14 (fourteen) days.          BP 100/65  Pulse 85  Temp(Src) 98.6 F (37 C) (Oral)  Resp 16  SpO2 97%  Filed Vitals:   08/11/13 1327 08/11/13 1344 08/11/13 1530 08/11/13 1545  BP: 70/53 94/54 91/66  100/65  Pulse: 99 94 89 85  Temp: 98 F (36.7 C) 98.6 F (37 C)    TempSrc: Oral Oral    Resp: 22 22 26 16   SpO2: 99% 97% 96% 97%    Physical Exam  Nursing note and vitals reviewed. Constitutional: He is oriented to person, place, and time. He appears well-developed and well-nourished. No distress.  Acutely ill appearing. Lethargic  HENT:  Head: Normocephalic and atraumatic.  Right Ear: External ear normal.  Left Ear: External ear normal.  Nose: Nose normal.  Dry mucus membranes  Eyes: Conjunctivae are normal. Right eye exhibits no discharge. Left eye exhibits no discharge.  Neck: Normal range of motion. Neck supple.  Cardiovascular: Regular rhythm, normal heart sounds and intact distal pulses.  Exam reveals no gallop and no friction rub.   No murmur heard. Tachycardic  Pulmonary/Chest: Effort normal and breath sounds normal. No respiratory distress. He has no wheezes. He has no rales. He exhibits no tenderness.  Abdominal: Soft. Bowel sounds are normal. He exhibits no distension and no mass.  There is tenderness. There is no rebound and no guarding.  Diffuse tenderness to palpation throughout. No guarding.   Musculoskeletal: Normal range of motion. He exhibits no edema and no tenderness.  Diffuse tenderness to palpation to the lumbar paraspinal muscles  Neurological: He is alert and oriented to person, place, and time.  Skin: Skin is warm and dry. He is not diaphoretic.    ED Course  Procedures (including critical care time) Labs Review Labs Reviewed - No data to display Imaging Review No results found.  EKG Interpretation    Date/Time:  Thursday August 11 2013 13:17:52 EST Ventricular Rate:  108 PR Interval:  161 QRS Duration: 101 QT Interval:  352 QTC Calculation: 472 R Axis:   7 Text Interpretation:  Sinus tachycardia Borderline T abnormalities, anterior leads Confirmed by WARD  DO, KRISTEN ([id number]) on 08/11/2013 1:23:16 PM           Results for orders placed during the hospital encounter of 08/11/13  CBC WITH DIFFERENTIAL      Result Value Ref Range   WBC 29.7 (*) 4.0 - 10.5 K/uL   RBC 4.86  4.22 - 5.81 MIL/uL   Hemoglobin 15.1  13.0 - 17.0 g/dL   HCT 16.143.0  09.639.0 - 04.552.0 %   MCV 88.5  78.0 - 100.0 fL   MCH 31.1  26.0 - 34.0 pg   MCHC 35.1  30.0 - 36.0 g/dL   RDW 40.914.1  81.111.5 - 91.415.5 %   Platelets 251  150 - 400 K/uL   Neutrophils Relative % 88 (*) 43 - 77 %   Lymphocytes Relative 3 (*) 12 - 46 %   Monocytes Relative 9  3 - 12 %  Eosinophils Relative 0  0 - 5 %   Basophils Relative 0  0 - 1 %   Neutro Abs 26.1 (*) 1.7 - 7.7 K/uL   Lymphs Abs 0.9  0.7 - 4.0 K/uL   Monocytes Absolute 2.7 (*) 0.1 - 1.0 K/uL   Eosinophils Absolute 0.0  0.0 - 0.7 K/uL   Basophils Absolute 0.0  0.0 - 0.1 K/uL   WBC Morphology VACUOLATED NEUTROPHILS    COMPREHENSIVE METABOLIC PANEL      Result Value Ref Range   Sodium 130 (*) 137 - 147 mEq/L   Potassium 3.0 (*) 3.7 - 5.3 mEq/L   Chloride 92 (*) 96 - 112 mEq/L   CO2 20  19 - 32 mEq/L   Glucose, Bld 193 (*) 70 - 99 mg/dL    BUN 24 (*) 6 - 23 mg/dL   Creatinine, Ser 1.61 (*) 0.50 - 1.35 mg/dL   Calcium 9.4  8.4 - 09.6 mg/dL   Total Protein 7.7  6.0 - 8.3 g/dL   Albumin 3.2 (*) 3.5 - 5.2 g/dL   AST 36  0 - 37 U/L   ALT 43  0 - 53 U/L   Alkaline Phosphatase 167 (*) 39 - 117 U/L   Total Bilirubin 1.1  0.3 - 1.2 mg/dL   GFR calc non Af Amer 38 (*) >90 mL/min   GFR calc Af Amer 44 (*) >90 mL/min  URINALYSIS, ROUTINE W REFLEX MICROSCOPIC      Result Value Ref Range   Color, Urine AMBER (*) YELLOW   APPearance CLOUDY (*) CLEAR   Specific Gravity, Urine 1.025  1.005 - 1.030   pH 5.5  5.0 - 8.0   Glucose, UA NEGATIVE  NEGATIVE mg/dL   Hgb urine dipstick LARGE (*) NEGATIVE   Bilirubin Urine SMALL (*) NEGATIVE   Ketones, ur NEGATIVE  NEGATIVE mg/dL   Protein, ur 30 (*) NEGATIVE mg/dL   Urobilinogen, UA 1.0  0.0 - 1.0 mg/dL   Nitrite NEGATIVE  NEGATIVE   Leukocytes, UA MODERATE (*) NEGATIVE  LACTIC ACID, PLASMA      Result Value Ref Range   Lactic Acid, Venous 4.2 (*) 0.5 - 2.2 mmol/L  URINE MICROSCOPIC-ADD ON      Result Value Ref Range   Squamous Epithelial / LPF RARE  RARE   WBC, UA 21-50  <3 WBC/hpf   RBC / HPF 11-20  <3 RBC/hpf   Bacteria, UA MANY (*) RARE   Urine-Other MUCOUS PRESENT    I-STAT CHEM 8, ED      Result Value Ref Range   Sodium 135 (*) 137 - 147 mEq/L   Potassium 3.0 (*) 3.7 - 5.3 mEq/L   Chloride 98  96 - 112 mEq/L   BUN 23  6 - 23 mg/dL   Creatinine, Ser 0.45 (*) 0.50 - 1.35 mg/dL   Glucose, Bld 409 (*) 70 - 99 mg/dL   Calcium, Ion 8.11  9.14 - 1.23 mmol/L   TCO2 20  0 - 100 mmol/L   Hemoglobin 16.7  13.0 - 17.0 g/dL   HCT 78.2  95.6 - 21.3 %  I-STAT CG4 LACTIC ACID, ED      Result Value Ref Range   Lactic Acid, Venous 4.70 (*) 0.5 - 2.2 mmol/L    CT Abdomen Pelvis Wo Contrast (Final result)  Result time: 08/11/13 14:48:18    Procedure changed from CT Abdomen Pelvis W Contrast       Final result by Rad Results In Interface (08/11/13  14:48:18)    Narrative:    CLINICAL DATA: Abdominal pain, fever, weakness, hypotension, history hypertension, kidney stones, fibromyalgia, hepatitis  EXAM: CT ABDOMEN AND PELVIS WITHOUT CONTRAST  TECHNIQUE: Multidetector CT imaging of the abdomen and pelvis was performed following the standard protocol without intravenous contrast. No oral contrast administered. Sagittal and coronal MPR images reconstructed from axial data set.  COMPARISON: 06/28/2013  FINDINGS: Bibasilar atelectasis.  Left ureteral stent extends from left renal pelvis into urinary bladder.  Bilateral nonobstructing renal calculi larger on left, largest 8 mm diameter.  Mild right hydronephrosis and ureteral dilatation.  Minimal nonspecific stranding of perinephric fat planes without discrete renal mass.  Post cholecystectomy.  Question mildly nodular hepatic margins cannot exclude cirrhosis.  Within limits of a nonenhanced exam no additional abnormalities of the liver, spleen, pancreas, or adrenal glands.  Scattered normal size gastrohepatic ligament, aortocaval, left periaortic and periportal lymph nodes.  Scattered atherosclerotic calcifications.  Stomach and bowel loops unremarkable.  No mass, adenopathy, free fluid or inflammatory process.  No acute osseous findings.  IMPRESSION: Bilateral renal calculi.  Left ureteral stent without left hydronephrosis.  Mild right hydronephrosis and hydroureter.  Question minimally nodular hepatic margins, cannot exclude cirrhosis.   Electronically Signed By: Ulyses Southward M.D. On: 08/11/2013 14:48      DG Chest Port 1 View (Final result)  Result time: 08/11/13 14:33:09    Final result by Rad Results In Interface (08/11/13 14:33:09)    Narrative:   CLINICAL DATA: Fever, tachycardia.  EXAM: PORTABLE CHEST - 1 VIEW  COMPARISON: June 04, 2010.  FINDINGS: Mild cardiomegaly is noted. Right paratracheal prominence is now visualized and increased compared to prior  exam. No pneumothorax or pleural effusion is noted. No acute pulmonary disease is noted. Bony thorax is intact  IMPRESSION: Right peritracheal prominence is seen concerning for possible neoplasm or adenopathy; CT scan of the chest with contrast administration is recommended.   Electronically Signed By: Roque Lias M.D. On: 08/11/2013 14:33      MDM   Edward [name] is a 58 y.o. male with a PMH of HTN, HLD, fibromyalgia, GERD, hx of kidney stones, depression, bilateral leg pain, OSA, and hepatitis who presents to the ED for evaluation of fever, tachycardia, dizziness and back pain.   Rechecks  4:00 PM = Patient more alert.  Talking with wife. BP improved at 100/65   Consults  4:40 PM = Spoke with Dr. Denna Haggard who will put in orders.     Patient meets sepsis criteria with hypotension (70/53), tachycardia (HR 99), tachypnea (RR 22), leukocytosis (29.7), and elevated lactic acid (4.2). Patient's BP improved with 2L IV fluids and is receiving the 3rd liter. BP 100/65. Source of infection likely due to urosepsis. Urine sent for culture. Blood cultures drawn. Patient started on Vancomycin and Cefepime (allergy to PCN - cannot give Zosyn). CT scan showed bilateral renal calculi, right hydronephrosis and hydroureter, and left ureteral stent without hydronephrosis. Patient admitted for continued management.    Final impressions: 1. Sepsis   2. Hypokalemia   3. Nephrolithiasis      Luiz Iron PA-C   This patient was discussed with Dr. Meryl Dare, PA-C 08/11/13 2028

## 2013-08-11 NOTE — Progress Notes (Signed)
ANTIBIOTIC CONSULT NOTE - INITIAL   Pharmacy Consult for cefepime, vancomycin Indication: sepsis  Allergies  Allergen Reactions  . Lyrica [Pregabalin] Swelling  . Penicillins Rash    Patient Measurements:   Data from 07/22/13: Weight 119.9kg Height  185 cm  Vital Signs: Temp: 98.6 F (37 C) (02/19 1344) Temp src: Oral (02/19 1344) BP: 94/54 mmHg (02/19 1344) Pulse Rate: 94 (02/19 1344) Intake/Output from previous day:   Intake/Output from this shift:    Labs:  Recent Labs  08/11/13 1352  HGB 16.7  CREATININE 2.00*   The CrCl is unknown because both a height and weight (above a minimum accepted value) are required for this calculation. No results found for this basename: VANCOTROUGH, Leodis BinetVANCOPEAK, VANCORANDOM, GENTTROUGH, GENTPEAK, GENTRANDOM, TOBRATROUGH, TOBRAPEAK, TOBRARND, AMIKACINPEAK, AMIKACINTROU, AMIKACIN,  in the last 72 hours   Estimated CrCl ~ 69 mL/min by Cockroft-Gault formula,                             ~40 mL/min when weight-normalized to 72kg  Microbiology: Recent Results (from the past 720 hour(s))  URINE CULTURE     Status: None   Collection Time    07/18/13  3:46 PM      Result Value Ref Range Status   Specimen Description URINE, CLEAN CATCH   Final   Special Requests NONE   Final   Culture  Setup Time     Final   Value: 07/18/2013 22:32     [facility]   Colony Count     Final   Value: NO GROWTH     [facility]   Culture     Final   Value: NO GROWTH     Performed at [facility]   Report Status 07/19/2013 FINAL   Final    Medical History: Past Medical History  Diagnosis Date  . Hypertension   . Hyperlipidemia   . Fibromyalgia   . GERD (gastroesophageal reflux disease)   . History of kidney stones     LONG HX AND CURRENTLY PASSED STONES ALL THE TIME  . Depression   . Leg pain, bilateral     aching of both legs - chronic  . OSA on CPAP   . History of hepatitis     CHILDHOOD--  UNKNOWN TYPE  . Renal calculus, left     MULTIPLE  . Wears contact lenses     Medications:  Scheduled:   Infusions:  . sodium chloride    . ceFEPime (MAXIPIME) IV    . vancomycin     PRN:   Assessment: 58 y/o M with history of nephrolithiasis, underwent lithotripsy, stone extraction, R ureteral stent removal and L ureteral stent exchange 08/05/13, presented to ED today and found to be in sepsis.   Code sepsis was called and empiric cefepime and vancomycin were ordered with pharmacy dosing assistance requested.   Hypotension and AKI vs prerenal azotemia noted (SCr 2.0 today, was 1.25 on 08/03/13).  Goal of Therapy:  Appropriate dosing of antibiotics; eradication of infection Vancomycin trough 15-20  Plan:  1. Cefepime 2 grams IV stat, then q12h. 2. Vancomycin 1 gram IV stat, repeat 1 gram tonight at 6pm, then 1500 mg IV q24h starting 2/20 at 6pm 3. Follow serum creatinine and further adjust antibiotic dosages as needed. 4. Follow clinical course, cultures. 5. Check vancomycin trough at steady-state, or sooner if clinical course warrants.  Elie Goodyandy Tjay Velazquez,  PharmD, BCPS Pager: [phone] 08/11/2013  2:13 PM    Latina Frank, Ky Barban 08/11/2013,1:55 PM

## 2013-08-12 LAB — BASIC METABOLIC PANEL
BUN: 23 mg/dL (ref 6–23)
CO2: 24 mEq/L (ref 19–32)
Calcium: 8.3 mg/dL — ABNORMAL LOW (ref 8.4–10.5)
Chloride: 97 mEq/L (ref 96–112)
Creatinine, Ser: 1.55 mg/dL — ABNORMAL HIGH (ref 0.50–1.35)
GFR, EST AFRICAN AMERICAN: 56 mL/min — AB (ref >90)
GFR, EST NON AFRICAN AMERICAN: 48 mL/min — AB (ref >90)
Glucose, Bld: 147 mg/dL — ABNORMAL HIGH (ref 70–99)
Potassium: 4.1 mEq/L (ref 3.7–5.3)
Sodium: 132 mEq/L — ABNORMAL LOW (ref 137–147)

## 2013-08-12 LAB — CBC
HCT: 36.8 % — ABNORMAL LOW (ref 39.0–52.0)
Hemoglobin: 12.6 g/dL — ABNORMAL LOW (ref 13.0–17.0)
MCH: 30.3 pg (ref 26.0–34.0)
MCHC: 34.2 g/dL (ref 30.0–36.0)
MCV: 88.5 fL (ref 78.0–100.0)
Platelets: 215 10*3/uL (ref 150–400)
RBC: 4.16 MIL/uL — ABNORMAL LOW (ref 4.22–5.81)
RDW: 14.3 % (ref 11.5–15.5)
WBC: 21.7 10*3/uL — ABNORMAL HIGH (ref 4.0–10.5)

## 2013-08-12 MED ORDER — ACETAMINOPHEN 325 MG PO TABS
650.0000 mg | ORAL_TABLET | Freq: Four times a day (QID) | ORAL | Status: DC | PRN
  Administered 2013-08-12 – 2013-08-17 (×5): 650 mg via ORAL
  Filled 2013-08-12 (×5): qty 2

## 2013-08-12 MED ORDER — BIOTENE DRY MOUTH MT LIQD
15.0000 mL | Freq: Two times a day (BID) | OROMUCOSAL | Status: DC
  Administered 2013-08-13 – 2013-08-20 (×12): 15 mL via OROMUCOSAL

## 2013-08-12 NOTE — Progress Notes (Signed)
Patient ID: Edward [name], male   DOB: 1955/11/13, 58 y.o.   MRN: [id number]  [facility] PROGRESS NOTE  SHAYDEN BOBIER WUJ:[id number] DOB: 10-31-1955 DOA: 08/11/2013 PCP: Loleta Dicker, FNP  Brief narrative: 58 y.o. male with a PMH of HTN, HLD, fibromyalgia, GERD, hx of kidney stones, depression, bilateral leg pain, OSA, and hepatitis who presents to the ED with main concern of sudden onset of fever of 101 - 102 F, lethargy, poor oral intake. Pt is unable to provide detailed history as he is rather somnolent. Wife at bedside explains that pt had right ureteral stent removal and left laser lithotripsy with stone extraction done 08/05/2013 by Dr. Marlou Porch. He went home and was initially doing well but 2 days piror to this admission he started to feel tired, has not been eating or drinking fluids, lightheaded and weak. This has been associated with worsening and constant lower back pain, throbbing and 10/10 in severity, non radiating, no specific alleviating or aggravating factors. Pt was seen in Dr. Jasmine Awe clinic, found to be hypotensive and tachycardic, he was sent to ED for further evaluation and [facility] asked to admit.   Assessment and Plan:  Active Problems:  SIRS/Sepsis  - of unclear etiology, possibly UTI, pt with fever, tachycardia, hypotension, elevated lactic acid  - slight clinical improvement, BP and HR stable and within target range, pt still somewhat somnolent - T max 100.5 F - continue vancomycin and Maxipime day #2 - continue IVF and follow up on urine and blood cultures   Right hydronephrosis  - management per urology team  - monitor urine output, 1L of output recorded since admission to SDU   Hypotension  - continue IVF as pt is responding well - will continue to hold Metoprolol, HCTZ  Hypokalemia  - supplemented and within normal limits this AM  - repeat BMP In AM  Acute renal failure  - continue IVF, Cr is trending down  - hold Mobic and other NSAID's for now   Leukocytosis  - secondary to principal problem, WBC trending down  - urine culture and blood culture pending  Right peritracheal prominence  - CT chest with contrast will have to wait due to acute renal failure but can be obtained in an outpatient setting   Radiological Exams on Admission:  Ct Abdomen Pelvis Wo Contrast 08/11/2013 Bilateral renal calculi. Left ureteral stent without left hydronephrosis. Mild right hydronephrosis and hydroureter. Question minimally nodular hepatic margins, cannot exclude cirrhosis.  Dg Chest Port 1 View 08/11/2013 Right peritracheal prominence is seen concerning for possible neoplasm or adenopathy; CT scan of the chest with contrast administration is recommended.   Consultants:  Urology Procedures/Studies: Ct Abdomen Pelvis Wo Contrast  08/11/2013   Bilateral renal calculi.  Left ureteral stent without left hydronephrosis.  Mild right hydronephrosis and hydroureter.   Dg Chest Port 1 View  08/11/2013  Right peritracheal prominence is seen concerning for possible neoplasm or adenopathy Antibiotics:  Vancomycin 2/19 -->  Maxipime 2/19 -->  Code Status: Full Family Communication: Pt at bedside Disposition Plan: Keep in SDU  HPI/Subjective: No events overnight.   Objective: Filed Vitals:   08/12/13 1000 08/12/13 1100 08/12/13 1200 08/12/13 1300  BP: 128/65 122/66 135/66 134/71  Pulse: 99 98 94 98  Temp:   100.2 F (37.9 C)   TempSrc:   Oral   Resp: 23 25 27 26   Height:      Weight:      SpO2: 91% 95% 100% 96%    Intake/Output  Summary (Last 24 hours) at 08/12/13 1339 Last data filed at 08/12/13 1300  Gross per 24 hour  Intake 2682.5 ml  Output   1025 ml  Net 1657.5 ml    Exam:   General:  Pt is somnolent but easy to arouse, follows commands appropriately and NAD   Cardiovascular: Regular rate and rhythm, S1/S2, no murmurs, no rubs, no gallops  Respiratory: Clear to auscultation bilaterally, no wheezing, diminished breath sounds at  bases   Abdomen: Soft, non tender, non distended, bowel sounds present, no guarding  Extremities: No edema, pulses DP and PT palpable bilaterally  Neuro: Grossly nonfocal but still somnolent   Data Reviewed: Basic Metabolic Panel:  Recent Labs Lab 08/11/13 1341 08/11/13 1352 08/11/13 1840 08/12/13 0315  NA 130* 135* 134* 132*  K 3.0* 3.0* 4.5 4.1  CL 92* 98 99 97  CO2 20  --  23 24  GLUCOSE 193* 189* 150* 147*  BUN 24* 23 23 23   CREATININE 1.88* 2.00* 1.68* 1.55*  CALCIUM 9.4  --  8.5 8.3*  MG  --   --  1.6  --   PHOS  --   --  2.3  --    Liver Function Tests:  Recent Labs Lab 08/11/13 1341  AST 36  ALT 43  ALKPHOS 167*  BILITOT 1.1  PROT 7.7  ALBUMIN 3.2*   CBC:  Recent Labs Lab 08/11/13 1341 08/11/13 1352 08/12/13 0315  WBC 29.7*  --  21.7*  NEUTROABS 26.1*  --   --   HGB 15.1 16.7 12.6*  HCT 43.0 49.0 36.8*  MCV 88.5  --  88.5  PLT 251  --  215   Recent Results (from the past 240 hour(s))  CULTURE, BLOOD (ROUTINE X 2)     Status: None   Collection Time    08/11/13  1:32 PM      Result Value Ref Range Status   Specimen Description BLOOD RIGHT WRIST   Final   Special Requests BOTTLES DRAWN AEROBIC AND ANAEROBIC 5CC EACH   Final   Culture  Setup Time     Final   Value: 08/11/2013 16:10     Performed at [facility]   Culture     Final   Value:        BLOOD CULTURE RECEIVED NO GROWTH TO DATE CULTURE WILL BE HELD FOR 5 DAYS BEFORE ISSUING A FINAL NEGATIVE REPORT     Performed at [facility]   Report Status PENDING   Incomplete  CULTURE, BLOOD (ROUTINE X 2)     Status: None   Collection Time    08/11/13  1:37 PM      Result Value Ref Range Status   Specimen Description BLOOD RIGHT HAND   Final   Special Requests BOTTLES DRAWN AEROBIC AND ANAEROBIC 5CC EACH   Final   Culture  Setup Time     Final   Value: 08/11/2013 16:09     Performed at [facility]   Culture     Final   Value:        BLOOD CULTURE RECEIVED NO GROWTH  TO DATE CULTURE WILL BE HELD FOR 5 DAYS BEFORE ISSUING A FINAL NEGATIVE REPORT     Performed at [facility]   Report Status PENDING   Incomplete  MRSA PCR SCREENING     Status: None   Collection Time    08/11/13  5:24 PM      Result Value Ref  Range Status   MRSA by PCR NEGATIVE  NEGATIVE Final   Comment:            The GeneXpert MRSA Assay (FDA     approved for NASAL specimens     only), is one component of a     comprehensive MRSA colonization     surveillance program. It is not     intended to diagnose MRSA     infection nor to guide or     monitor treatment for     MRSA infections.     Scheduled Meds: . ceFEPime (MAXIPIME) IV  2 g Intravenous Q12H  . citalopram  40 mg Oral q morning - 10a  . enoxaparin (LOVENOX) injection  60 mg Subcutaneous QHS  . pantoprazole  40 mg Oral Daily  . simvastatin  40 mg Oral QHS  . vancomycin  1,500 mg Intravenous Q24H   Continuous Infusions: . sodium chloride 75 mL/hr at 08/12/13 1300   Debbora PrestoMAGICK-Derelle Cockrell, MD  [facility] Pager [phone]  If 7PM-7AM, please contact night-coverage [email or web address] Password [email or web address] 08/12/2013, 1:39 PM   LOS: 1 day

## 2013-08-12 NOTE — Progress Notes (Signed)
CARE MANAGEMENT NOTE 08/12/2013  Patient:  Edward [name],Edward [name]   Account Number:  [id number]  Date Initiated:  08/12/2013  Documentation initiated by:  [place]  Subjective/Objective Assessment:   pt with recent uro/renal stone surg. now with sepsis     Action/Plan:   home when stable   Anticipated DC Date:  08/15/2013   Anticipated DC Plan:  HOME/SELF CARE  In-house referral  NA      DC Planning Services  NA      PAC Choice  NA   Choice offered to / List presented to:  NA   DME arranged  NA      DME agency  NA     HH arranged  NA      HH agency  NA   Status of service:  In process, will continue to follow Medicare Important Message given?  NA - LOS <3 / Initial given by admissions (If response is "NO", the following Medicare IM given date fields will be blank) Date Medicare IM given:   Date Additional Medicare IM given:    Discharge Disposition:    Per UR Regulation:  Reviewed for med. necessity/level of care/duration of stay  If discussed at Long Length of Stay Meetings, dates discussed:    Comments:  [id number]/Aylssa Herrig Stark JockDavis, RN, BSN, [place] [phone] Chart Reviewed for discharge and hospital needs. Discharge needs at time of review:  None present will follow for needs. Review of patient progress due on [id number].

## 2013-08-12 NOTE — Progress Notes (Signed)
Inpatient Diabetes Program Recommendations  AACE/ADA: New Consensus Statement on Inpatient Glycemic Control (2013)  Target Ranges:  Prepandial:   less than 140 mg/dL      Peak postprandial:   less than 180 mg/dL (1-2 hours)      Critically ill patients:  140 - 180 mg/dL   Results for Marcie MowersELSON, Kuzey E (MRN [id number]) as of 08/12/2013 12:47  Ref. Range 08/11/2013 13:41 08/11/2013 13:52 08/11/2013 18:40 08/12/2013 03:15  Glucose Latest Range: 70-99 mg/dL 409193 (H) 811189 (H) 914150 (H) 147 (H)   Diabetes history: NO Outpatient Diabetes medications: NA Current orders for Inpatient glycemic control: NONE  Inpatient Diabetes Program Recommendations Correction (SSI): May want to consider ordering CBGs with Novolog correction while inpatient. HgbA1C: Please consider ordering an A1C to evaluate glycemic control over the past 2-3 months. Diet: May want to consider changing diet from regular to carb modified.  Note: Noted initial lab glucose of 193 mg/dl and fasting glucose this morning on labs was 147 mg/dl.  Patient does not have a documented history of diabetes noted in the chart.  Please consider ordering an A1C to evaluate glycemic control over the past 2-3 months.  Also, may want to consider ordering CBGs with Novolog correction scale and changing diet to Carb Mod if appropriate.    Thanks, Orlando PennerMarie Gaylon Bentz, RN, MSN, CCRN Diabetes Coordinator Inpatient Diabetes Program [phone] (Team Pager) [phone] (AP office) [phone] [facility](MC office)

## 2013-08-13 LAB — URINE CULTURE

## 2013-08-13 LAB — CBC
HCT: 36.2 % — ABNORMAL LOW (ref 39.0–52.0)
Hemoglobin: 12.1 g/dL — ABNORMAL LOW (ref 13.0–17.0)
MCH: 30 pg (ref 26.0–34.0)
MCHC: 33.4 g/dL (ref 30.0–36.0)
MCV: 89.6 fL (ref 78.0–100.0)
PLATELETS: 181 10*3/uL (ref 150–400)
RBC: 4.04 MIL/uL — ABNORMAL LOW (ref 4.22–5.81)
RDW: 14.7 % (ref 11.5–15.5)
WBC: 14.6 10*3/uL — AB (ref 4.0–10.5)

## 2013-08-13 LAB — BASIC METABOLIC PANEL
BUN: 22 mg/dL (ref 6–23)
CALCIUM: 8.6 mg/dL (ref 8.4–10.5)
CHLORIDE: 100 meq/L (ref 96–112)
CO2: 23 mEq/L (ref 19–32)
Creatinine, Ser: 1.49 mg/dL — ABNORMAL HIGH (ref 0.50–1.35)
GFR calc Af Amer: 58 mL/min — ABNORMAL LOW (ref >90)
GFR, EST NON AFRICAN AMERICAN: 50 mL/min — AB (ref >90)
Glucose, Bld: 165 mg/dL — ABNORMAL HIGH (ref 70–99)
Potassium: 3.7 mEq/L (ref 3.7–5.3)
Sodium: 135 mEq/L — ABNORMAL LOW (ref 137–147)

## 2013-08-13 MED ORDER — ZOLPIDEM TARTRATE 5 MG PO TABS
5.0000 mg | ORAL_TABLET | Freq: Every evening | ORAL | Status: DC | PRN
  Administered 2013-08-13 – 2013-08-19 (×6): 5 mg via ORAL
  Filled 2013-08-13 (×6): qty 1

## 2013-08-13 NOTE — Progress Notes (Signed)
Patient ID: Edward MowersKevan E Sarkisyan, male   DOB: 1956/04/20, 58 y.o.   MRN: [id number]  [facility] PROGRESS NOTE  Edward MowersKevan E Simer XBJ:[id number] DOB: 1956/04/20 DOA: 08/11/2013 PCP: Loleta DickerJUDGE,ERIN, FNP  Brief narrative:  58 y.o. male with a PMH of HTN, HLD, fibromyalgia, GERD, hx of kidney stones, depression, bilateral leg pain, OSA, and hepatitis who presents to the ED with main concern of sudden onset of fever of 101 - 102 F, lethargy, poor oral intake. Pt is unable to provide detailed history as he is rather somnolent. Wife at bedside explains that pt had right ureteral stent removal and left laser lithotripsy with stone extraction done 08/05/2013 by Dr. Marlou PorchHerrick. He went home and was initially doing well but 2 days piror to this admission he started to feel tired, has not been eating or drinking fluids, lightheaded and weak. This has been associated with worsening and constant lower back pain, throbbing and 10/10 in severity, non radiating, no specific alleviating or aggravating factors. Pt was seen in Dr. Jasmine AweHerrick's clinic, found to be hypotensive and tachycardic, he was sent to ED for further evaluation and [facility] asked to admit.   Assessment and Plan:  Active Problems:  SIRS/Sepsis  - possibly UTI, pt with fever, tachycardia, hypotension, elevated lactic acid on admission  - slight clinical improvement again over he past 24 hours, BP and HR stable and within target range, pt mentally clearer this AM  - T max 100.1 F over the past 24 hours  - continue vancomycin and Maxipime day #3 - continue IVF and follow up on urine and blood cultures  Right hydronephrosis  - management per urology team, appreciate Input   - monitor urine output - no indication for na intervention at this time  Hypotension  - continue IVF, pt is responding well  - will continue to hold Metoprolol, HCTZ  Hypokalemia  - supplemented and within normal limits this AM  - repeat BMP In AM  Acute renal failure  - continue IVF, Cr is  trending down  - continue to hold Mobic and other NSAID's for now  Leukocytosis  - secondary to principal problem, WBC continues trending down  - urine culture and blood culture pending but no growth to date Right peritracheal prominence  - CT chest with contrast will have to wait due to acute renal failure but can be obtained in an outpatient setting   Radiological Exams on Admission:  Ct Abdomen Pelvis Wo Contrast 08/11/2013 Bilateral renal calculi. Left ureteral stent without left hydronephrosis. Mild right hydronephrosis and hydroureter. Question minimally nodular hepatic margins, cannot exclude cirrhosis.  Dg Chest Port 1 View 08/11/2013 Right peritracheal prominence is seen concerning for possible neoplasm or adenopathy; CT scan of the chest with contrast administration is recommended.   Consultants:  Urology Procedures/Studies:  Ct Abdomen Pelvis Wo Contrast 08/11/2013 Bilateral renal calculi. Left ureteral stent without left hydronephrosis. Mild right hydronephrosis and hydroureter.  Dg Chest Port 1 View 08/11/2013 Right peritracheal prominence is seen concerning for possible neoplasm or adenopathy Antibiotics:  Vancomycin 2/19 -->  Maxipime 2/19 -->  Code Status: Full  Family Communication: Pt at bedside  Disposition Plan: Keep in SDU  HPI/Subjective: No events overnight.   Objective: Filed Vitals:   08/13/13 0600 08/13/13 0700 08/13/13 0800 08/13/13 0900  BP: 164/75  149/82   Pulse: 94 94 89 86  Temp:   100.1 F (37.8 C)   TempSrc:   Oral   Resp: 14 22 22 19   Height:  Weight:      SpO2: 97% 95% 94% 95%    Intake/Output Summary (Last 24 hours) at 08/13/13 1105 Last data filed at 08/13/13 0915  Gross per 24 hour  Intake 2172.5 ml  Output   1200 ml  Net  972.5 ml    Exam:   General:  Pt is alert, follows commands appropriately, not in acute distress  Cardiovascular: Regular rate and rhythm, S1/S2, no murmurs, no rubs, no gallops  Respiratory: Clear to  auscultation bilaterally, no wheezing, diminished breath sounds at bases   Abdomen: Soft, non tender, non distended, bowel sounds present, no guarding  Extremities: No edema, pulses DP and PT palpable bilaterally  Neuro: Grossly nonfocal  Data Reviewed: Basic Metabolic Panel:  Recent Labs Lab 08/11/13 1341 08/11/13 1352 08/11/13 1840 08/12/13 0315 08/13/13 0321  NA 130* 135* 134* 132* 135*  K 3.0* 3.0* 4.5 4.1 3.7  CL 92* 98 99 97 100  CO2 20  --  23 24 23   GLUCOSE 193* 189* 150* 147* 165*  BUN 24* 23 23 23 22   CREATININE 1.88* 2.00* 1.68* 1.55* 1.49*  CALCIUM 9.4  --  8.5 8.3* 8.6  MG  --   --  1.6  --   --   PHOS  --   --  2.3  --   --    Liver Function Tests:  Recent Labs Lab 08/11/13 1341  AST 36  ALT 43  ALKPHOS 167*  BILITOT 1.1  PROT 7.7  ALBUMIN 3.2*   CBC:  Recent Labs Lab 08/11/13 1341 08/11/13 1352 08/12/13 0315 08/13/13 0321  WBC 29.7*  --  21.7* 14.6*  NEUTROABS 26.1*  --   --   --   HGB 15.1 16.7 12.6* 12.1*  HCT 43.0 49.0 36.8* 36.2*  MCV 88.5  --  88.5 89.6  PLT 251  --  215 181   Recent Results (from the past 240 hour(s))  CULTURE, BLOOD (ROUTINE X 2)     Status: None   Collection Time    08/11/13  1:32 PM      Result Value Ref Range Status   Specimen Description BLOOD RIGHT WRIST   Final   Special Requests BOTTLES DRAWN AEROBIC AND ANAEROBIC 5CC EACH   Final   Culture  Setup Time     Final   Value: 08/11/2013 16:10     Performed at [facility]   Culture     Final   Value:        BLOOD CULTURE RECEIVED NO GROWTH TO DATE CULTURE WILL BE HELD FOR 5 DAYS BEFORE ISSUING A FINAL NEGATIVE REPORT     Performed at [facility]   Report Status PENDING   Incomplete  CULTURE, BLOOD (ROUTINE X 2)     Status: None   Collection Time    08/11/13  1:37 PM      Result Value Ref Range Status   Specimen Description BLOOD RIGHT HAND   Final   Special Requests BOTTLES DRAWN AEROBIC AND ANAEROBIC 5CC EACH   Final   Culture  Setup  Time     Final   Value: 08/11/2013 16:09     Performed at [facility]   Culture     Final   Value:        BLOOD CULTURE RECEIVED NO GROWTH TO DATE CULTURE WILL BE HELD FOR 5 DAYS BEFORE ISSUING A FINAL NEGATIVE REPORT     Performed at [facility]  Report Status PENDING   Incomplete  MRSA PCR SCREENING     Status: None   Collection Time    08/11/13  5:24 PM      Result Value Ref Range Status   MRSA by PCR NEGATIVE  NEGATIVE Final   Comment:            The GeneXpert MRSA Assay (FDA     approved for NASAL specimens     only), is one component of a     comprehensive MRSA colonization     surveillance program. It is not     intended to diagnose MRSA     infection nor to guide or     monitor treatment for     MRSA infections.     Scheduled Meds: . ceFEPime (MAXIPIME) IV  2 g Intravenous Q12H  . citalopram  40 mg Oral q morning - 10a  . enoxaparin  injection  60 mg Subcutaneous QHS  . pantoprazole  40 mg Oral Daily  . simvastatin  40 mg Oral QHS  . vancomycin  1,500 mg Intravenous Q24H   Continuous Infusions: . sodium chloride 75 mL/hr at 08/13/13 0900     Debbora Presto, MD  [facility] Pager [phone]  If 7PM-7AM, please contact night-coverage [email or web address] Password [email or web address] 08/13/2013, 11:05 AM   LOS: 2 days

## 2013-08-13 NOTE — Progress Notes (Signed)
[name] ID: Edward [name], male   DOB: 1956/01/07, 58 y.o.   MRN: [id number] I spoke with Dr. Izola PriceMyers last night regarding this [name] who Dr. Marlou PorchHerrick performed surgery on recently. Edward [name] findings intraoperatively were those of a dilated collecting system on Edward right-hand side without evidence of ureteral obstruction on retrograde pyelogram. He performed left ureteroscopy and laser lithotripsy with stone extraction and left a stent indwelling with a string. He informed Edward [name] to remove Edward stent at home after 7 days.  Edward [name] was admitted to [facility] hospital with sepsis and a CT scan was obtained. It revealed Edward dilation previously noted on Edward right-hand side and I told Dr. Izola PriceMyers that in light of Edward retrograde pyelogram that revealed no evidence of obstruction I was not concerned by this finding. Edward left-hand side had a stent in place in good position and was adequately drained. I did not feel stent removal at this time would be appropriate.  I discussed with Edward [name] this morning Edward fact that although he was supposed to have Edward [name] stent removed 7 days after Edward [name] surgery in light of Edward [name] recent infection I felt leaving Edward stent indwelling was Edward safest thing for him. I told him that Edward removal of Edward stent would likely not result in any obstruction of Edward [name] kidney however even if there was a small chance of this occurring, because he is tolerating Edward stent, I would recommend it remain indwelling for another day or 2. He understands.  I will make Dr. Marlou PorchHerrick aware of Edward [name] admission to Edward hospital and will allow him to determine when he would like Edward stent to be removed.

## 2013-08-14 ENCOUNTER — Inpatient Hospital Stay (HOSPITAL_COMMUNITY): Payer: BC Managed Care – PPO

## 2013-08-14 DIAGNOSIS — J9601 Acute respiratory failure with hypoxia: Secondary | ICD-10-CM

## 2013-08-14 LAB — BASIC METABOLIC PANEL
BUN: 17 mg/dL (ref 6–23)
CHLORIDE: 100 meq/L (ref 96–112)
CO2: 25 mEq/L (ref 19–32)
Calcium: 8.4 mg/dL (ref 8.4–10.5)
Creatinine, Ser: 1.15 mg/dL (ref 0.50–1.35)
GFR, EST AFRICAN AMERICAN: 80 mL/min — AB (ref >90)
GFR, EST NON AFRICAN AMERICAN: 69 mL/min — AB (ref >90)
Glucose, Bld: 129 mg/dL — ABNORMAL HIGH (ref 70–99)
POTASSIUM: 3.6 meq/L — AB (ref 3.7–5.3)
SODIUM: 135 meq/L — AB (ref 137–147)

## 2013-08-14 LAB — CBC
HCT: 35.4 % — ABNORMAL LOW (ref 39.0–52.0)
Hemoglobin: 12 g/dL — ABNORMAL LOW (ref 13.0–17.0)
MCH: 29.9 pg (ref 26.0–34.0)
MCHC: 33.9 g/dL (ref 30.0–36.0)
MCV: 88.1 fL (ref 78.0–100.0)
PLATELETS: 177 10*3/uL (ref 150–400)
RBC: 4.02 MIL/uL — ABNORMAL LOW (ref 4.22–5.81)
RDW: 14.5 % (ref 11.5–15.5)
WBC: 10.1 10*3/uL (ref 4.0–10.5)

## 2013-08-14 LAB — PRO B NATRIURETIC PEPTIDE: PRO B NATRI PEPTIDE: 534.8 pg/mL — AB (ref 0–125)

## 2013-08-14 MED ORDER — FUROSEMIDE 10 MG/ML IJ SOLN
40.0000 mg | Freq: Once | INTRAMUSCULAR | Status: AC
  Administered 2013-08-14: 40 mg via INTRAVENOUS
  Filled 2013-08-14: qty 4

## 2013-08-14 MED ORDER — METOPROLOL SUCCINATE ER 50 MG PO TB24
50.0000 mg | ORAL_TABLET | Freq: Every day | ORAL | Status: DC
  Administered 2013-08-14 – 2013-08-19 (×6): 50 mg via ORAL
  Filled 2013-08-14 (×7): qty 1

## 2013-08-14 MED ORDER — CIPROFLOXACIN HCL 500 MG PO TABS
500.0000 mg | ORAL_TABLET | Freq: Two times a day (BID) | ORAL | Status: DC
  Administered 2013-08-14 – 2013-08-19 (×11): 500 mg via ORAL
  Filled 2013-08-14 (×15): qty 1

## 2013-08-14 MED ORDER — OXYCODONE HCL 5 MG PO TABS
15.0000 mg | ORAL_TABLET | ORAL | Status: DC | PRN
  Administered 2013-08-14 – 2013-08-20 (×25): 15 mg via ORAL
  Filled 2013-08-14 (×24): qty 3

## 2013-08-14 MED ORDER — HYDROMORPHONE HCL PF 1 MG/ML IJ SOLN
1.0000 mg | INTRAMUSCULAR | Status: DC | PRN
  Administered 2013-08-14 – 2013-08-16 (×4): 1 mg via INTRAVENOUS
  Filled 2013-08-14 (×5): qty 1

## 2013-08-14 NOTE — Progress Notes (Signed)
Patient ID: Edward [name], male   DOB: 1955-07-20, 58 y.o.   MRN: [id number]  [facility] PROGRESS NOTE  Edward [name] WUJ:[id number] DOB: Oct 29, 1955 DOA: 08/11/2013 PCP: Loleta Dicker, FNP  Brief narrative:  58 y.o. male with a PMH of HTN, HLD, fibromyalgia, GERD, hx of kidney stones, depression, bilateral leg pain, OSA, and hepatitis who presents to the ED with main concern of sudden onset of fever of 101 - 102 F, lethargy, poor oral intake. Pt is unable to provide detailed history as he is rather somnolent. Wife at bedside explains that pt had right ureteral stent removal and left laser lithotripsy with stone extraction done 08/05/2013 by Dr. Marlou Porch. He went home and was initially doing well but 2 days piror to this admission he started to feel tired, has not been eating or drinking fluids, lightheaded and weak. This has been associated with worsening and constant lower back pain, throbbing and 10/10 in severity, non radiating, no specific alleviating or aggravating factors. Pt was seen in Dr. Jasmine Awe clinic, found to be hypotensive and tachycardic, he was sent to ED for further evaluation and [facility] asked to admit.   Assessment and Plan:  Active Problems:  SIRS/Sepsis likely secondary to pyelonephritis.  UCx growing enterococcus s. To FQ.  He had not been taking cipro prior to admission even though this was listed on his home medication list.   -  D/c vanc and cefepime -  BCx NGTD -  Start ciprofloxacin  Right hydronephrosis, stable per urology, voiding without difficulty.  Acute hypoxic respiratory failure likely secondary to acute on chronic diastolic heart failure, but cannot exclude acute COPD exac given hx of tobacco abuse -  CXR -  Pro -BNP -  Treatment pending above  Hypertension.  Previous hypotension now resolved.   -  Restart BB  -  Hold diuretic due to recent AKI  Hypokalemia,  - given oral KCl this AM  Acute renal failure resolving - continue IVF, Cr is trending down   - continue to hold Mobic and other NSAID's for now   Leukocytosis resolving with abx -  Continue abx as above  Right peritracheal prominence  - CT chest with contrast will have to wait due to acute renal failure but can be obtained in an outpatient setting   Radiological Exams on Admission:  Ct Abdomen Pelvis Wo Contrast 08/11/2013 Bilateral renal calculi. Left ureteral stent without left hydronephrosis. Mild right hydronephrosis and hydroureter. Question minimally nodular hepatic margins, cannot exclude cirrhosis.  Dg Chest Port 1 View 08/11/2013 Right peritracheal prominence is seen concerning for possible neoplasm or adenopathy; CT scan of the chest with contrast administration is recommended.   Consultants:  Urology Procedures/Studies:  Ct Abdomen Pelvis Wo Contrast 08/11/2013 Bilateral renal calculi. Left ureteral stent without left hydronephrosis. Mild right hydronephrosis and hydroureter.  Dg Chest Port 1 View 08/11/2013 Right peritracheal prominence is seen concerning for possible neoplasm or adenopathy Antibiotics:  Vancomycin 2/19 --> 2/22 Maxipime 2/19 -->2/22  Code Status: Full  Family Communication: Pt at bedside  Disposition Plan:   Transfer to telemetry  HPI/Subjective: No events overnight.  Having full body aches today.  Denies fveers, chills, dysuria.  Voiding easily.    Objective: Filed Vitals:   08/14/13 0400 08/14/13 0710 08/14/13 0800 08/14/13 0900  BP: 146/84 149/87    Pulse: 87 86 90 81  Temp: 99.1 F (37.3 C)     TempSrc: Oral     Resp: 19 18 17 18   Height:  Weight: 120.9 kg (266 lb 8.6 oz)     SpO2: 97% 98% 97% 95%    Intake/Output Summary (Last 24 hours) at 08/14/13 0935 Last data filed at 08/14/13 [id number]  Gross per 24 hour  Intake   1808 ml  Output   1075 ml  Net    733 ml    Exam:   General:  Pt is alert, follows commands appropriately, not in acute distress  Cardiovascular: Regular rate and rhythm, S1/S2, no murmurs, no rubs, no  gallops  Respiratory:  Rales at bilateral bases, full exp wheeze, diminished at bases, no rhonchi diminished breath sounds at bases   Abdomen: Soft, non tender, non distended, bowel sounds present, no guarding  Extremities: No edema, pulses DP and PT palpable bilaterally  Neuro: Grossly nonfocal  Data Reviewed: Basic Metabolic Panel:  Recent Labs Lab 08/11/13 1341 08/11/13 1352 08/11/13 1840 08/12/13 0315 08/13/13 0321 08/14/13 0328  NA 130* 135* 134* 132* 135* 135*  K 3.0* 3.0* 4.5 4.1 3.7 3.6*  CL 92* 98 99 97 100 100  CO2 20  --  23 24 23 25   GLUCOSE 193* 189* 150* 147* 165* 129*  BUN 24* 23 23 23 22 17   CREATININE 1.88* 2.00* 1.68* 1.55* 1.49* 1.15  CALCIUM 9.4  --  8.5 8.3* 8.6 8.4  MG  --   --  1.6  --   --   --   PHOS  --   --  2.3  --   --   --    Liver Function Tests:  Recent Labs Lab 08/11/13 1341  AST 36  ALT 43  ALKPHOS 167*  BILITOT 1.1  PROT 7.7  ALBUMIN 3.2*   CBC:  Recent Labs Lab 08/11/13 1341 08/11/13 1352 08/12/13 0315 08/13/13 0321 08/14/13 0328  WBC 29.7*  --  21.7* 14.6* 10.1  NEUTROABS 26.1*  --   --   --   --   HGB 15.1 16.7 12.6* 12.1* 12.0*  HCT 43.0 49.0 36.8* 36.2* 35.4*  MCV 88.5  --  88.5 89.6 88.1  PLT 251  --  215 181 177   Recent Results (from the past 240 hour(s))  CULTURE, BLOOD (ROUTINE X 2)     Status: None   Collection Time    08/11/13  1:32 PM      Result Value Ref Range Status   Specimen Description BLOOD RIGHT WRIST   Final   Special Requests BOTTLES DRAWN AEROBIC AND ANAEROBIC 5CC EACH   Final   Culture  Setup Time     Final   Value: 08/11/2013 16:10     Performed at [facility]   Culture     Final   Value:        BLOOD CULTURE RECEIVED NO GROWTH TO DATE CULTURE WILL BE HELD FOR 5 DAYS BEFORE ISSUING A FINAL NEGATIVE REPORT     Performed at [facility]   Report Status PENDING   Incomplete  CULTURE, BLOOD (ROUTINE X 2)     Status: None   Collection Time    08/11/13  1:37 PM       Result Value Ref Range Status   Specimen Description BLOOD RIGHT HAND   Final   Special Requests BOTTLES DRAWN AEROBIC AND ANAEROBIC [facility] EACH   Final   Culture  Setup Time     Final   Value: 08/11/2013 16:09     Performed at [facility]   Culture  Final   Value:        BLOOD CULTURE RECEIVED NO GROWTH TO DATE CULTURE WILL BE HELD FOR 5 DAYS BEFORE ISSUING A FINAL NEGATIVE REPORT     Performed at [facility]   Report Status PENDING   Incomplete  URINE CULTURE     Status: None   Collection Time    08/11/13  3:43 PM      Result Value Ref Range Status   Specimen Description URINE, CLEAN CATCH   Final   Special Requests NONE   Final   Culture  Setup Time     Final   Value: 08/11/2013 22:40     Performed at [facility] Count     Final   Value: >=100,000 COLONIES/ML     Performed at [facility]   Culture     Final   Value: ENTEROCOCCUS SPECIES     Performed at [facility]   Report Status 08/13/2013 FINAL   Final   Organism ID, Bacteria ENTEROCOCCUS SPECIES   Final  MRSA PCR SCREENING     Status: None   Collection Time    08/11/13  5:24 PM      Result Value Ref Range Status   MRSA by PCR NEGATIVE  NEGATIVE Final   Comment:            The GeneXpert MRSA Assay (FDA     approved for NASAL specimens     only), is one component of a     comprehensive MRSA colonization     surveillance program. It is not     intended to diagnose MRSA     infection nor to guide or     monitor treatment for     MRSA infections.     Scheduled Meds: . ceFEPime (MAXIPIME) IV  2 g Intravenous Q12H  . citalopram  40 mg Oral q morning - 10a  . enoxaparin  injection  60 mg Subcutaneous QHS  . pantoprazole  40 mg Oral Daily  . simvastatin  40 mg Oral QHS  . vancomycin  1,500 mg Intravenous Q24H   Continuous Infusions: . sodium chloride 10 mL/hr at 08/14/13 0718     Renae Fickle, MD  [facility] Pager [phone]  If 7PM-7AM, please contact  night-coverage [email or web address] Password [facility] 08/14/2013, 9:35 AM   LOS: 3 days

## 2013-08-15 DIAGNOSIS — J9600 Acute respiratory failure, unspecified whether with hypoxia or hypercapnia: Secondary | ICD-10-CM

## 2013-08-15 LAB — CBC
HEMATOCRIT: 38 % — AB (ref 39.0–52.0)
Hemoglobin: 12.8 g/dL — ABNORMAL LOW (ref 13.0–17.0)
MCH: 29.7 pg (ref 26.0–34.0)
MCHC: 33.7 g/dL (ref 30.0–36.0)
MCV: 88.2 fL (ref 78.0–100.0)
Platelets: 192 10*3/uL (ref 150–400)
RBC: 4.31 MIL/uL (ref 4.22–5.81)
RDW: 14.3 % (ref 11.5–15.5)
WBC: 12.2 10*3/uL — AB (ref 4.0–10.5)

## 2013-08-15 LAB — BASIC METABOLIC PANEL
BUN: 14 mg/dL (ref 6–23)
CHLORIDE: 94 meq/L — AB (ref 96–112)
CO2: 32 meq/L (ref 19–32)
CREATININE: 1.32 mg/dL (ref 0.50–1.35)
Calcium: 8.9 mg/dL (ref 8.4–10.5)
GFR calc non Af Amer: 58 mL/min — ABNORMAL LOW (ref >90)
GFR, EST AFRICAN AMERICAN: 68 mL/min — AB (ref >90)
Glucose, Bld: 108 mg/dL — ABNORMAL HIGH (ref 70–99)
POTASSIUM: 3.6 meq/L — AB (ref 3.7–5.3)
SODIUM: 135 meq/L — AB (ref 137–147)

## 2013-08-15 MED ORDER — PROCHLORPERAZINE EDISYLATE 5 MG/ML IJ SOLN
10.0000 mg | Freq: Once | INTRAMUSCULAR | Status: AC
  Administered 2013-08-15: 10 mg via INTRAVENOUS
  Filled 2013-08-15: qty 2

## 2013-08-15 MED ORDER — FUROSEMIDE 10 MG/ML IJ SOLN: 20.0000 mg | Freq: Every day | INTRAMUSCULAR | Status: DC

## 2013-08-15 MED ORDER — KETOROLAC TROMETHAMINE 15 MG/ML IJ SOLN
30.0000 mg | Freq: Four times a day (QID) | INTRAMUSCULAR | Status: DC | PRN
  Administered 2013-08-15: 30 mg via INTRAVENOUS
  Filled 2013-08-15: qty 2

## 2013-08-15 NOTE — Consult Note (Addendum)
I have been asked to see [name] by Dr. Crista ElliotIskra Meyers in consultation for pyelonephritis and nephrolithiasis.  HPI: [name] is a 58 year old who is well known to me for extensive nephrolithiasis. Most recently he had bilateral ureteroscopy, right stent removal, left ureteroscopy/laser lithotripsy and left ureteral stent placement. This was performed on 08/05/13. The patient was discharged home. He was doing well initially and then presented to the clinic in extremis. He was subsequently admitted and found to have a urinary tract infection.    The patient currently feels worse now than yesterday.  His main complaint is his headache.  He has had more left flank/back pain today.  He is voiding without difficulty. He denies any dysuria or gross hematuria.  The patient has not had a bowel movement.   Scheduled Meds: . antiseptic oral rinse  15 mL Mouth Rinse BID  . ciprofloxacin  500 mg Oral BID  . citalopram  40 mg Oral q morning - 10a  . enoxaparin (LOVENOX) injection  60 mg Subcutaneous QHS  . metoprolol succinate  50 mg Oral QHS  . pantoprazole  40 mg Oral Daily  . simvastatin  40 mg Oral QHS   Continuous Infusions: . sodium chloride 10 mL/hr at 08/14/13 0718   PRN Meds:.acetaminophen, docusate sodium, HYDROmorphone (DILAUDID) injection, ketorolac, ondansetron (ZOFRAN) IV, ondansetron, oxyCODONE, zolpidem  Patient Active Problem List   Diagnosis Date Noted  . Acute respiratory failure with hypoxia 08/14/2013  . Sepsis 08/11/2013  . Nephrolithiasis 07/22/2013  . Renal calculi 07/22/2013   Past Medical History  Diagnosis Date  . Hypertension   . Hyperlipidemia   . Fibromyalgia   . GERD (gastroesophageal reflux disease)   . History of kidney stones     LONG HX AND CURRENTLY PASSED STONES ALL THE TIME  . Depression   . Leg pain, bilateral     aching of both legs - chronic  . OSA on CPAP   . History of hepatitis     CHILDHOOD-- UNKNOWN TYPE  . Renal calculus, left     MULTIPLE   . Wears contact lenses    Past Surgical History  Procedure Laterality Date  . Cystoscopy with retrograde pyelogram, ureteroscopy and stent placement Bilateral 07/22/2013    Procedure: CYSTOSCOPY WITH LEFT AND RIGHT  URETEROSCOPY ,HOLMIUM LASER LITHTRIPSY,  STENT PLACEMENT, RIGHT AND LEFT  RETROGRAM PYELOGRAM ;  Surgeon: Crist FatBenjamin W Kass Herberger, MD;  Location: [facility];  Service: Urology;  Laterality: Bilateral;  . Nephrolithotomy Right 07/22/2013    Procedure: RIGHT PERCUTANEOUS NEPHROLITHOTOMY SURGEON ACCESS ;  Surgeon: Crist FatBenjamin W Tamalyn Wadsworth, MD;  Location: [facility];  Service: Urology;  Laterality: Right;  . Holmium laser application Bilateral 07/22/2013    Procedure: HOLMIUM LASER APPLICATION;  Surgeon: Crist FatBenjamin W Keyden Pavlov, MD;  Location: [facility];  Service: Urology;  Laterality: Bilateral;  . Right ureteroscopic stone extraction  02-17-2000  . Percutaneous nephrostolithotomy Right 06-02-2008  . Extracorporeal shock wave lithotripsy  MULTIPLE  . Multiple cysto/ ureteoscopic stone extractions  PRIOR TO 2001  . Appendectomy  1970's  . Cholecystectomy  2001  . Cystoscopy with retrograde pyelogram, ureteroscopy and stent placement Bilateral 08/05/2013    Procedure: BILATERAL URETEROSCOPY WITH BILATERAL RETROGRADE PYELOGRAM, RIGHT STENT REMOVAL, LEFT LASER LITHOTRIPSY  AND LEFT URETEDRAL STENT EXCHANGE;  Surgeon: Crist FatBenjamin W Berthe Oley, MD;  [facility];  Service: Urology;  Laterality: Bilateral;  . Holmium laser application Left 08/05/2013    Procedure: HOLMIUM LASER APPLICATION;  Surgeon: Crist FatBenjamin W Bijal Siglin, MD;  Location: [facility];  Service: Urology;  Laterality: Left;    PE; NAD Filed Vitals:   08/14/13 2348 08/15/13 0618 08/15/13 0859 08/15/13 1353  BP: 150/87 136/74  123/69  Pulse: 84 75  79  Temp: 98.3 F (36.8 C) 98.4 F (36.9 C)  100.1 F (37.8 C)  TempSrc: Oral Oral  Oral  Resp: 18 18  16   Height:      Weight:   116.4 kg (256 lb 9.9 oz)   SpO2: 95% 95%   94%   Normal respiratory effort Regular rate and rhythm Soft, nontender nondistended, no CVA tenderness Lower extremities are symmetric No skin lesions or rashes Neurological he is grossly intact Psychologically he is alert and oriented, with a normal affect   Recent Labs  08/13/13 0321 08/14/13 0328 08/15/13 0505  WBC 14.6* 10.1 12.2*  HGB 12.1* 12.0* 12.8*  HCT 36.2* 35.4* 38.0*    Recent Labs  08/13/13 0321 08/14/13 0328 08/15/13 0505  NA 135* 135* 135*  K 3.7 3.6* 3.6*  CL 100 100 94*  CO2 23 25 32  GLUCOSE 165* 129* 108*  BUN 22 17 14   CREATININE 1.49* 1.15 1.32  CALCIUM 8.6 8.4 8.9   No results found for this basename: LABPT, INR,  in the last 72 hours No results found for this basename: LABURIN,  in the last 72 hours Results for orders placed during the hospital encounter of 08/11/13  CULTURE, BLOOD (ROUTINE X 2)     Status: None   Collection Time    08/11/13  1:32 PM      Result Value Ref Range Status   Specimen Description BLOOD RIGHT WRIST   Final   Special Requests BOTTLES DRAWN AEROBIC AND ANAEROBIC 5CC EACH   Final   Culture  Setup Time     Final   Value: 08/11/2013 16:10     Performed at [facility]   Culture     Final   Value:        BLOOD CULTURE RECEIVED NO GROWTH TO DATE CULTURE WILL BE HELD FOR 5 DAYS BEFORE ISSUING A FINAL NEGATIVE REPORT     Performed at [facility]   Report Status PENDING   Incomplete  CULTURE, BLOOD (ROUTINE X 2)     Status: None   Collection Time    08/11/13  1:37 PM      Result Value Ref Range Status   Specimen Description BLOOD RIGHT HAND   Final   Special Requests BOTTLES DRAWN AEROBIC AND ANAEROBIC 5CC EACH   Final   Culture  Setup Time     Final   Value: 08/11/2013 16:09     Performed at [facility]   Culture     Final   Value:        BLOOD CULTURE RECEIVED NO GROWTH TO DATE CULTURE WILL BE HELD FOR 5 DAYS BEFORE ISSUING A FINAL NEGATIVE REPORT     Performed at [facility]    Report Status PENDING   Incomplete  URINE CULTURE     Status: None   Collection Time    08/11/13  3:43 PM      Result Value Ref Range Status   Specimen Description URINE, CLEAN CATCH   Final   Special Requests NONE   Final   Culture  Setup Time     Final   Value: 08/11/2013 22:40     Performed at [facility] Count  Final   Value: >=100,000 COLONIES/ML     Performed at [facility]   Culture     Final   Value: ENTEROCOCCUS SPECIES     Performed at [facility]   Report Status 08/13/2013 FINAL   Final   Organism ID, Bacteria ENTEROCOCCUS SPECIES   Final  MRSA PCR SCREENING     Status: None   Collection Time    08/11/13  5:24 PM      Result Value Ref Range Status   MRSA by PCR NEGATIVE  NEGATIVE Final   Comment:            The GeneXpert MRSA Assay (FDA     approved for NASAL specimens     only), is one component of a     comprehensive MRSA colonization     surveillance program. It is not     intended to diagnose MRSA     infection nor to guide or     monitor treatment for     MRSA infections.   CT ab/pelv: I've independently reviewed the images. The patient has chronically dilated right collecting system, there is no evidence of obstruction on the right side. Patient has a left ureteral stent placed which is appropriately positioned. The patient has persistent stones within the left renal pelvis. There no significant other findings.  Impression: Complicated enterococcus UTI ? Migraine headache  Recommendations: Agree with narrowing the antibiotic spectrum to Cipro which is appropriate for this enterococcus. I will stop by tomorrow morning and remove the patient's stent. Would recommend the patient get a total of 10 days of antibiotics.  I will write for a one time dose of compazine for the patient's headache.  I will leave the remaining aspects of the patient's care to internal medicine, appreciate their help in dealing with this  patient's complicated UTI.

## 2013-08-15 NOTE — Progress Notes (Signed)
Patient ID: Edward [name], male   DOB: 1956-05-05, 58 y.o.   MRN: [id number] [facility] PROGRESS NOTE  Edward [name] NWG:[id number] DOB: 01-10-1956 DOA: 08/11/2013 PCP: Loleta Dicker, FNP  Brief narrative:  58 y.o. male with a PMH of HTN, HLD, fibromyalgia, GERD, hx of kidney stones, depression, bilateral leg pain, OSA, and hepatitis who presents to the ED with main concern of sudden onset of fever of 101 - 102 F, lethargy, poor oral intake. Pt is unable to provide detailed history as he is rather somnolent. Wife at bedside explains that pt had right ureteral stent removal and left laser lithotripsy with stone extraction done 08/05/2013 by Dr. Marlou Porch. He went home and was initially doing well but 2 days piror to this admission he started to feel tired, has not been eating or drinking fluids, lightheaded and weak. This has been associated with worsening and constant lower back pain, throbbing and 10/10 in severity, non radiating, no specific alleviating or aggravating factors. Pt was seen in Dr. Jasmine Awe clinic, found to be hypotensive and tachycardic, he was sent to ED for further evaluation and [facility] asked to admit.   Assessment and Plan:  Active Problems:  SIRS/Sepsis - likely secondary to pyelonephritis. UCx growing enterococcus s. To FQ. He had not been taking cipro prior to admission even though this was listed on his home medication list.  - off vanc and cefepime  - BCx NGTD  - Started ciprofloxacin 2/22 Right hydronephrosis - stable per urology, voiding without difficulty.  Acute hypoxic respiratory failure  - likely secondary to acute on chronic diastolic heart failure, but cannot exclude acute COPD exac given hx of tobacco abuse  - CXR with developing vascular congestion, stopped IVF 2/22 - weight 256 lbs this AM - place on Lasix if weight trending up Hypertension.  - Previous hypotension now resolved.  - Restart BB  - Hold diuretic due to recent AKI but if weight trending up,  consider starting low dose Lasix  Hypokalemia,  - given oral KCl this AM  Acute renal failure resolving  - Cr is trending down and WNL this AM  - continue to hold Mobic and other NSAID's for now  Leukocytosis resolving with abx  - Continue abx as above  Right peritracheal prominence  - CT chest with contrast will have to wait due to acute renal failure but can be obtained in an outpatient setting   Radiological Exams on Admission:  Ct Abdomen Pelvis Wo Contrast 08/11/2013 Bilateral renal calculi. Left ureteral stent without left hydronephrosis. Mild right hydronephrosis and hydroureter. Question minimally nodular hepatic margins, cannot exclude cirrhosis.  Dg Chest Port 1 View 08/11/2013 Right peritracheal prominence is seen concerning for possible neoplasm or adenopathy; CT scan of the chest with contrast administration is recommended.   Consultants:  Urology Procedures/Studies:  Ct Abdomen Pelvis Wo Contrast 08/11/2013 Bilateral renal calculi. Left ureteral stent without left hydronephrosis. Mild right hydronephrosis and hydroureter.  Dg Chest Port 1 View 08/11/2013 Right peritracheal prominence is seen concerning for possible neoplasm or adenopathy Antibiotics:  Vancomycin 2/19 --> 2/22  Maxipime 2/19 -->2/22  Code Status: Full  Family Communication: Pt at bedside  Disposition Plan: Transfer to telemetry   HPI/Subjective: No events overnight.   Objective: Filed Vitals:   08/14/13 2348 08/15/13 0618 08/15/13 0859 08/15/13 1353  BP: 150/87 136/74  123/69  Pulse: 84 75  79  Temp: 98.3 F (36.8 C) 98.4 F (36.9 C)  100.1 F (37.8 C)  TempSrc: Oral Oral  Oral  Resp: 18 18  16   Height:      Weight:   116.4 kg (256 lb 9.9 oz)   SpO2: 95% 95%  94%    Intake/Output Summary (Last 24 hours) at 08/15/13 1905 Last data filed at 08/15/13 1835  Gross per 24 hour  Intake    760 ml  Output   3300 ml  Net  -2540 ml    Exam:   General:  Pt is alert, follows commands  appropriately, not in acute distress  Cardiovascular: Regular rate and rhythm, S1/S2, no murmurs, no rubs, no gallops  Respiratory: Clear to auscultation bilaterally, no wheezing, no crackles, no rhonchi  Abdomen: Soft, non tender, non distended, bowel sounds present, no guarding  Extremities: No edema, pulses DP and PT palpable bilaterally  Neuro: Grossly nonfocal  Data Reviewed: Basic Metabolic Panel:  Recent Labs Lab 08/11/13 1352 08/11/13 1840 08/12/13 0315 08/13/13 0321 08/14/13 0328 08/15/13 0505  NA 135* 134* 132* 135* 135* 135*  K 3.0* 4.5 4.1 3.7 3.6* 3.6*  CL 98 99 97 100 100 94*  CO2  --  23 24 23 25  32  GLUCOSE 189* 150* 147* 165* 129* 108*  BUN 23 23 23 22 17 14   CREATININE 2.00* 1.68* 1.55* 1.49* 1.15 1.32  CALCIUM  --  8.5 8.3* 8.6 8.4 8.9  MG  --  1.6  --   --   --   --   PHOS  --  2.3  --   --   --   --    Liver Function Tests:  Recent Labs Lab 08/11/13 1341  AST 36  ALT 43  ALKPHOS 167*  BILITOT 1.1  PROT 7.7  ALBUMIN 3.2*   CBC:  Recent Labs Lab 08/11/13 1341 08/11/13 1352 08/12/13 0315 08/13/13 0321 08/14/13 0328 08/15/13 0505  WBC 29.7*  --  21.7* 14.6* 10.1 12.2*  NEUTROABS 26.1*  --   --   --   --   --   HGB 15.1 16.7 12.6* 12.1* 12.0* 12.8*  HCT 43.0 49.0 36.8* 36.2* 35.4* 38.0*  MCV 88.5  --  88.5 89.6 88.1 88.2  PLT 251  --  215 181 177 192    Recent Results (from the past 240 hour(s))  CULTURE, BLOOD (ROUTINE X 2)     Status: None   Collection Time    08/11/13  1:32 PM      Result Value Ref Range Status   Specimen Description BLOOD RIGHT WRIST   Final   Special Requests BOTTLES DRAWN AEROBIC AND ANAEROBIC 5CC EACH   Final   Culture  Setup Time     Final   Value: 08/11/2013 16:10     Performed at [facility]   Culture     Final   Value:        BLOOD CULTURE RECEIVED NO GROWTH TO DATE CULTURE WILL BE HELD FOR 5 DAYS BEFORE ISSUING A FINAL NEGATIVE REPORT     Performed at [facility]   Report Status  PENDING   Incomplete  CULTURE, BLOOD (ROUTINE X 2)     Status: None   Collection Time    08/11/13  1:37 PM      Result Value Ref Range Status   Specimen Description BLOOD RIGHT HAND   Final   Special Requests BOTTLES DRAWN AEROBIC AND ANAEROBIC [facility]5CC EACH   Final   Culture  Setup Time     Final   Value: 08/11/2013 16:09  Performed at [facility]     Final   Value:        BLOOD CULTURE RECEIVED NO GROWTH TO DATE CULTURE WILL BE HELD FOR 5 DAYS BEFORE ISSUING A FINAL NEGATIVE REPORT     Performed at [facility]   Report Status PENDING   Incomplete  URINE CULTURE     Status: None   Collection Time    08/11/13  3:43 PM      Result Value Ref Range Status   Specimen Description URINE, CLEAN CATCH   Final   Special Requests NONE   Final   Culture  Setup Time     Final   Value: 08/11/2013 22:40     Performed at [facility] Count     Final   Value: >=100,000 COLONIES/ML     Performed at [facility]   Culture     Final   Value: ENTEROCOCCUS SPECIES     Performed at [facility]   Report Status 08/13/2013 FINAL   Final   Organism ID, Bacteria ENTEROCOCCUS SPECIES   Final  MRSA PCR SCREENING     Status: None   Collection Time    08/11/13  5:24 PM      Result Value Ref Range Status   MRSA by PCR NEGATIVE  NEGATIVE Final   Comment:            The GeneXpert MRSA Assay (FDA     approved for NASAL specimens     only), is one component of a     comprehensive MRSA colonization     surveillance program. It is not     intended to diagnose MRSA     infection nor to guide or     monitor treatment for     MRSA infections.     Scheduled Meds: . antiseptic oral rinse  15 mL Mouth Rinse BID  . ciprofloxacin  500 mg Oral BID  . citalopram  40 mg Oral q morning - 10a  . enoxaparin (LOVENOX) injection  60 mg Subcutaneous QHS  . metoprolol succinate  50 mg Oral QHS  . pantoprazole  40 mg Oral Daily  . simvastatin  40 mg Oral QHS    Continuous Infusions: . sodium chloride 10 mL/hr at 08/14/13 1610     Debbora Presto, MD  [facility] Pager [phone]  If 7PM-7AM, please contact night-coverage [email or web address] Password [email or web address] 08/15/2013, 7:05 PM   LOS: 4 days

## 2013-08-16 DIAGNOSIS — I10 Essential (primary) hypertension: Secondary | ICD-10-CM

## 2013-08-16 LAB — BASIC METABOLIC PANEL
BUN: 20 mg/dL (ref 6–23)
CHLORIDE: 92 meq/L — AB (ref 96–112)
CO2: 29 mEq/L (ref 19–32)
Calcium: 8.8 mg/dL (ref 8.4–10.5)
Creatinine, Ser: 1.32 mg/dL (ref 0.50–1.35)
GFR calc non Af Amer: 58 mL/min — ABNORMAL LOW (ref >90)
GFR, EST AFRICAN AMERICAN: 68 mL/min — AB (ref >90)
Glucose, Bld: 139 mg/dL — ABNORMAL HIGH (ref 70–99)
Potassium: 3.5 mEq/L — ABNORMAL LOW (ref 3.7–5.3)
Sodium: 131 mEq/L — ABNORMAL LOW (ref 137–147)

## 2013-08-16 LAB — CBC
HCT: 36.9 % — ABNORMAL LOW (ref 39.0–52.0)
HEMOGLOBIN: 12.3 g/dL — AB (ref 13.0–17.0)
MCH: 29.4 pg (ref 26.0–34.0)
MCHC: 33.3 g/dL (ref 30.0–36.0)
MCV: 88.1 fL (ref 78.0–100.0)
Platelets: 210 10*3/uL (ref 150–400)
RBC: 4.19 MIL/uL — ABNORMAL LOW (ref 4.22–5.81)
RDW: 14.3 % (ref 11.5–15.5)
WBC: 10.5 10*3/uL (ref 4.0–10.5)

## 2013-08-16 MED ORDER — POLYETHYLENE GLYCOL 3350 17 G PO PACK
17.0000 g | PACK | Freq: Every day | ORAL | Status: DC
  Administered 2013-08-16 – 2013-08-20 (×5): 17 g via ORAL
  Filled 2013-08-16 (×5): qty 1

## 2013-08-16 MED ORDER — POTASSIUM CHLORIDE CRYS ER 20 MEQ PO TBCR
40.0000 meq | EXTENDED_RELEASE_TABLET | Freq: Once | ORAL | Status: AC
  Administered 2013-08-16: 40 meq via ORAL
  Filled 2013-08-16: qty 2

## 2013-08-16 MED ORDER — LACTULOSE 10 GM/15ML PO SOLN
20.0000 g | Freq: Once | ORAL | Status: AC
  Administered 2013-08-16: 20 g via ORAL
  Filled 2013-08-16: qty 30

## 2013-08-16 MED ORDER — LACTULOSE 10 GM/15ML PO SOLN
20.0000 g | Freq: Two times a day (BID) | ORAL | Status: DC | PRN
  Filled 2013-08-16 (×3): qty 30

## 2013-08-16 NOTE — Progress Notes (Signed)
[facility] PROGRESS NOTE  Edward [name] ZOX:[id number] DOB: [birth year] DOA: 08/11/2013 PCP: Loleta Dicker, FNP  Assessment/Plan: 1. Sepsis, present on admission, evidenced by a white count of 21,700 likely secondary to pyelonephritis. Urine cultures growing enterococcus species, susceptibilities reviewed, organism sensitive to vancomycin, nitrofurantoin, Levaquin and ampicillin. He is currently on ciprofloxacin 500 mg by mouth twice a day. He spiked a low-grade temperature 100.1 yesterday afternoon. 2. Right hydronephrosis. Patient with history of bilateral nephrolithiasis, undergoing cystoscopy on 08/05/2013. 3. Acute kidney injury. Patient having a creatinine of 2.0 on 08/11/2013, trending down to 1.32 on 08/16/2013. 4. Hypertension. Continue beta blocker 5. Hypokalemia. Patient receiving potassium replacement, followup on a.m. labs  Code Status: Full Code Family Communication: Family members not present Disposition Plan: Followup on lab work, patient showing clinical improvement, anticipate discharge in the next 24-48 hours   Consultants:  Urology: Dr.Herrick   Antibiotics:  Ciprofloxacin 500 mg by mouth twice a day  HPI/Subjective: Patient is a pleasant 58 year old gentleman with a past medical history nephrolithiasis, undergoing cystoscopy, procedure performed by Dr. Marlou Porch on 08/05/2013 with right ureteral stent removal. Patient also undergoing left ureteral stent exchange with left laser lithotripsy stone extraction. He tolerated procedure well at the time with no immediate complications. He was admitted to the medicine service on 08/11/2013 presenting with generalized weakness, malaise and overall feeling ill. He was found to have a low-grade temperature. He was admitted to the step down unit, placed on IV fluids and start her initially on broad-spectrum IV antibiotic therapy with vancomycin and Maxipime. A CT scan of abdomen and pelvis performed on 08/11/2013 showing left  ureteral stent extending from the left renal pelvis into urinary bladder, bilateral nonobstructing renal calculi, mild right hydronephrosis and ureteral dilatation. Patient was seen and evaluated by Dr. Marlou Porch of urology during this hospitalization. Urine cultures growing greater than 100,000 colony-forming units of enterococcus we she's, organism sensitive to vancomycin, nitrofurantoin, levofloxacin, ampicillin. He was transitioned to ciprofloxacin 500 mg by mouth twice a day.  Objective: Filed Vitals:   08/16/13 1502  BP: 137/79  Pulse: 81  Temp: 99.5 F (37.5 C)  Resp: 16    Intake/Output Summary (Last 24 hours) at 08/16/13 1707 Last data filed at 08/16/13 1504  Gross per 24 hour  Intake    840 ml  Output   1750 ml  Net   -910 ml   Filed Weights   08/14/13 0400 08/15/13 0859 08/16/13 0631  Weight: 120.9 kg (266 lb 8.6 oz) 116.4 kg (256 lb 9.9 oz) 116.6 kg (257 lb 0.9 oz)    Exam:  General: Pt is alert, follows commands appropriately, not in acute distress  Cardiovascular: Regular rate and rhythm, S1/S2, no murmurs, no rubs, no gallops  Respiratory: Clear to auscultation bilaterally, no wheezing, no crackles, no rhonchi  Abdomen: Soft, non tender, non distended, bowel sounds present, no guarding  Extremities: No edema, pulses DP and PT palpable bilaterally  Neuro: Grossly nonfocal   Data Reviewed: Basic Metabolic Panel:  Recent Labs Lab 08/11/13 1352 08/11/13 1840 08/12/13 0315 08/13/13 0321 08/14/13 0328 08/15/13 0505 08/16/13 0525  NA 135* 134* 132* 135* 135* 135* 131*  K 3.0* 4.5 4.1 3.7 3.6* 3.6* 3.5*  CL 98 99 97 100 100 94* 92*  CO2  --  23 24 23 25  32 29  GLUCOSE 189* 150* 147* 165* 129* 108* 139*  BUN 23 23 23 22 17 14 20   CREATININE 2.00* 1.68* 1.55* 1.49* 1.15 1.32 1.32  CALCIUM  --  8.5 8.3* 8.6 8.4 8.9 8.8  MG  --  1.6  --   --   --   --   --   PHOS  --  2.3  --   --   --   --   --    Liver Function Tests:  Recent Labs Lab 08/11/13 1341   AST 36  ALT 43  ALKPHOS 167*  BILITOT 1.1  PROT 7.7  ALBUMIN 3.2*   No results found for this basename: LIPASE, AMYLASE,  in the last 168 hours No results found for this basename: AMMONIA,  in the last 168 hours CBC:  Recent Labs Lab 08/11/13 1341  08/12/13 0315 08/13/13 0321 08/14/13 0328 08/15/13 0505 08/16/13 0525  WBC 29.7*  --  21.7* 14.6* 10.1 12.2* 10.5  NEUTROABS 26.1*  --   --   --   --   --   --   HGB 15.1  < > 12.6* 12.1* 12.0* 12.8* 12.3*  HCT 43.0  < > 36.8* 36.2* 35.4* 38.0* 36.9*  MCV 88.5  --  88.5 89.6 88.1 88.2 88.1  PLT 251  --  215 181 177 192 210  < > = values in this interval not displayed. Cardiac Enzymes: No results found for this basename: CKTOTAL, CKMB, CKMBINDEX, TROPONINI,  in the last 168 hours BNP (last 3 results)  Recent Labs  08/14/13 0328  PROBNP 534.8*   CBG: No results found for this basename: GLUCAP,  in the last 168 hours  Recent Results (from the past 240 hour(s))  CULTURE, BLOOD (ROUTINE X 2)     Status: None   Collection Time    08/11/13  1:32 PM      Result Value Ref Range Status   Specimen Description BLOOD RIGHT WRIST   Final   Special Requests BOTTLES DRAWN AEROBIC AND ANAEROBIC 5CC EACH   Final   Culture  Setup Time     Final   Value: 08/11/2013 16:10     Performed at [facility]   Culture     Final   Value:        BLOOD CULTURE RECEIVED NO GROWTH TO DATE CULTURE WILL BE HELD FOR 5 DAYS BEFORE ISSUING A FINAL NEGATIVE REPORT     Performed at [facility]   Report Status PENDING   Incomplete  CULTURE, BLOOD (ROUTINE X 2)     Status: None   Collection Time    08/11/13  1:37 PM      Result Value Ref Range Status   Specimen Description BLOOD RIGHT HAND   Final   Special Requests BOTTLES DRAWN AEROBIC AND ANAEROBIC 5CC EACH   Final   Culture  Setup Time     Final   Value: 08/11/2013 16:09     Performed at [facility]   Culture     Final   Value:        BLOOD CULTURE RECEIVED NO GROWTH TO  DATE CULTURE WILL BE HELD FOR 5 DAYS BEFORE ISSUING A FINAL NEGATIVE REPORT     Performed at [facility]   Report Status PENDING   Incomplete  URINE CULTURE     Status: None   Collection Time    08/11/13  3:43 PM      Result Value Ref Range Status   Specimen Description URINE, CLEAN CATCH   Final   Special Requests NONE   Final   Culture  Setup Time     Final  Value: 08/11/2013 22:40     [facility]formed [facility]   Colony Count     Final   Value: >=100,000 COLONIES/ML     [facility]   Culture     Final   Value: ENTEROCOCCUS SPECIES     [facility]formed at [facility]   Report Status 08/13/2013 FINAL   Final   Organism ID, Bacteria ENTEROCOCCUS SPECIES   Final  MRSA PCR SCREENING     Status: None   Collection Time    08/11/13  5:24 PM      Result Value Ref Range Status   MRSA by PCR NEGATIVE  NEGATIVE Final   Comment:            The GeneXpert MRSA Assay (FDA     approved for NASAL specimens     only), is one component of a     comprehensive MRSA colonization     surveillance program. It is not     intended to diagnose MRSA     infection nor to guide or     monitor treatment for     MRSA infections.     Studies: No results found.  Scheduled Meds: . antiseptic oral rinse  15 mL Mouth Rinse BID  . ciprofloxacin  500 mg Oral BID  . citalopram  40 mg Oral q morning - 10a  . enoxaparin (LOVENOX) injection  60 mg Subcutaneous QHS  . metoprolol succinate  50 mg Oral QHS  . pantoprazole  40 mg Oral Daily  . polyethylene glycol  17 g Oral Daily  . simvastatin  40 mg Oral QHS   Continuous Infusions: . sodium chloride 10 mL/hr at 08/14/13 16100718    Active Problems:   Sepsis   Acute respiratory failure with hypoxia    Time spent: 35 min    Jeralyn BennettZAMORA, Edward [name]  [facility] Pager [phone]. If 7PM-7AM, please contact night-coverage at [email or web address], password [email or web address] 08/16/2013, 5:07 PM  LOS: 5 days

## 2013-08-16 NOTE — Progress Notes (Signed)
ID: Edward [name] is a 58 year old who is well known to me for extensive nephrolithiasis. Most recently he had bilateral ureteroscopy, right stent removal, left ureteroscopy/laser lithotripsy and left ureteral stent placement. This was performed on 08/05/13. The patient was discharged home. He was doing well initially and then presented to the clinic in extremis. He was subsequently admitted and found to have a urinary tract infection.    Interval: Compazine helped with headache last night, pain has returned, more nasal drainage (bloody), blurred vision Less flank pain this AM. Stent was removed by me this AM after getting his ABX  Scheduled Meds: . antiseptic oral rinse  15 mL Mouth Rinse BID  . ciprofloxacin  500 mg Oral BID  . citalopram  40 mg Oral q morning - 10a  . enoxaparin (LOVENOX) injection  60 mg Subcutaneous QHS  . metoprolol succinate  50 mg Oral QHS  . pantoprazole  40 mg Oral Daily  . simvastatin  40 mg Oral QHS   Continuous Infusions: . sodium chloride 10 mL/hr at 08/14/13 0718   PRN Meds:.acetaminophen, docusate sodium, HYDROmorphone (DILAUDID) injection, ketorolac, ondansetron (ZOFRAN) IV, ondansetron, oxyCODONE, zolpidem  Patient Active Problem List   Diagnosis Date Noted  . Acute respiratory failure with hypoxia 08/14/2013  . Sepsis 08/11/2013  . Nephrolithiasis 07/22/2013  . Renal calculi 07/22/2013   Past Medical History  Diagnosis Date  . Hypertension   . Hyperlipidemia   . Fibromyalgia   . GERD (gastroesophageal reflux disease)   . History of kidney stones     LONG HX AND CURRENTLY PASSED STONES ALL THE TIME  . Depression   . Leg pain, bilateral     aching of both legs - chronic  . OSA on CPAP   . History of hepatitis     CHILDHOOD-- UNKNOWN TYPE  . Renal calculus, left     MULTIPLE  . Wears contact lenses    Past Surgical History  Procedure Laterality Date  . Cystoscopy with retrograde pyelogram, ureteroscopy and stent placement Bilateral  07/22/2013    Procedure: CYSTOSCOPY WITH LEFT AND RIGHT  URETEROSCOPY ,HOLMIUM LASER LITHTRIPSY,  STENT PLACEMENT, RIGHT AND LEFT  RETROGRAM PYELOGRAM ;  Surgeon: Crist Fat, MD;  Location: [facility];  Service: Urology;  Laterality: Bilateral;  . Nephrolithotomy Right 07/22/2013    Procedure: RIGHT PERCUTANEOUS NEPHROLITHOTOMY SURGEON ACCESS ;  Surgeon: Crist Fat, MD;  Location: [facility];  Service: Urology;  Laterality: Right;  . Holmium laser application Bilateral 07/22/2013    Procedure: HOLMIUM LASER APPLICATION;  Surgeon: Crist Fat, MD;  Location: [facility];  Service: Urology;  Laterality: Bilateral;  . Right ureteroscopic stone extraction  02-17-2000  . Percutaneous nephrostolithotomy Right 06-02-2008  . Extracorporeal shock wave lithotripsy  MULTIPLE  . Multiple cysto/ ureteoscopic stone extractions  PRIOR TO 2001  . Appendectomy  1970's  . Cholecystectomy  2001  . Cystoscopy with retrograde pyelogram, ureteroscopy and stent placement Bilateral 08/05/2013    Procedure: BILATERAL URETEROSCOPY WITH BILATERAL RETROGRADE PYELOGRAM, RIGHT STENT REMOVAL, LEFT LASER LITHOTRIPSY  AND LEFT URETEDRAL STENT EXCHANGE;  Surgeon: Crist Fat, MD;  Location: [facility];  Service: Urology;  Laterality: Bilateral;  . Holmium laser application Left 08/05/2013    Procedure: HOLMIUM LASER APPLICATION;  Surgeon: Crist Fat, MD;  Location: [facility];  Service: Urology;  Laterality: Left;    PE; NAD Filed Vitals:   08/15/13 1353 08/15/13 2209 08/16/13 0558 08/16/13 0631  BP: 123/69 146/77 165/77  Pulse: 79 62 77   Temp: 100.1 F (37.8 C) 97.8 F (36.6 C) 98.4 F (36.9 C)   TempSrc: Oral Oral Oral   Resp: 16 18 18    Height:      Weight:    116.6 kg (257 lb 0.9 oz)  SpO2: 94% 95% 93%    No CVA tenderness, abdomen soft   Recent Labs  08/14/13 0328 08/15/13 0505 08/16/13 0525  WBC 10.1 12.2* 10.5  HGB 12.0* 12.8* 12.3*  HCT 35.4*  38.0* 36.9*    Recent Labs  08/14/13 0328 08/15/13 0505 08/16/13 0525  NA 135* 135* 131*  K 3.6* 3.6* 3.5*  CL 100 94* 92*  CO2 25 32 29  GLUCOSE 129* 108* 139*  BUN 17 14 20   CREATININE 1.15 1.32 1.32  CALCIUM 8.4 8.9 8.8   No results found for this basename: LABPT, INR,  in the last 72 hours No results found for this basename: LABURIN,  in the last 72 hours Results for orders placed during the hospital encounter of 08/11/13  CULTURE, BLOOD (ROUTINE X 2)     Status: None   Collection Time    08/11/13  1:32 PM      Result Value Ref Range Status   Specimen Description BLOOD RIGHT WRIST   Final   Special Requests BOTTLES DRAWN AEROBIC AND ANAEROBIC 5CC EACH   Final   Culture  Setup Time     Final   Value: 08/11/2013 16:10     Performed at [facility]   Culture     Final   Value:        BLOOD CULTURE RECEIVED NO GROWTH TO DATE CULTURE WILL BE HELD FOR 5 DAYS BEFORE ISSUING A FINAL NEGATIVE REPORT     Performed at [facility]   Report Status PENDING   Incomplete  CULTURE, BLOOD (ROUTINE X 2)     Status: None   Collection Time    08/11/13  1:37 PM      Result Value Ref Range Status   Specimen Description BLOOD RIGHT HAND   Final   Special Requests BOTTLES DRAWN AEROBIC AND ANAEROBIC 5CC EACH   Final   Culture  Setup Time     Final   Value: 08/11/2013 16:09     Performed at [facility]   Culture     Final   Value:        BLOOD CULTURE RECEIVED NO GROWTH TO DATE CULTURE WILL BE HELD FOR 5 DAYS BEFORE ISSUING A FINAL NEGATIVE REPORT     Performed at [facility]   Report Status PENDING   Incomplete  URINE CULTURE     Status: None   Collection Time    08/11/13  3:43 PM      Result Value Ref Range Status   Specimen Description URINE, CLEAN CATCH   Final   Special Requests NONE   Final   Culture  Setup Time     Final   Value: 08/11/2013 22:40     Performed at [facility] Count     Final   Value: >=100,000 COLONIES/ML      Performed at [facility]   Culture     Final   Value: ENTEROCOCCUS SPECIES     Performed at [facility]   Report Status 08/13/2013 FINAL   Final   Organism ID, Bacteria ENTEROCOCCUS SPECIES   Final  MRSA PCR SCREENING  Status: None   Collection Time    08/11/13  5:24 PM      Result Value Ref Range Status   MRSA by PCR NEGATIVE  NEGATIVE Final   Comment:            The GeneXpert MRSA Assay (FDA     approved for NASAL specimens     only), is one component of a     comprehensive MRSA colonization     surveillance program. It is not     intended to diagnose MRSA     infection nor to guide or     monitor treatment for     MRSA infections.   CT ab/pelv: I've independently reviewed the images. The patient has chronically dilated right collecting system, there is no evidence of obstruction on the right side. Patient has a left ureteral stent placed which is appropriately positioned. The patient has persistent stones within the left renal pelvis. There no significant other findings.  Impression: Complicated enterococcus UTI/ Pyelonephritis Likely sinus infection/URI  Recommendations: Stent has been removed. Would check creatinine in AM to ensure that he's not obstructed, but suspect he'll be fine. Cipro x 10 days. Will continue to follow.

## 2013-08-16 NOTE — Progress Notes (Signed)
Pt is using his home CPAP machine, mask and tubing. Pt does not need any assistance with mask application or his machine. Pt was made aware if he should need any assistance to contact RT.

## 2013-08-17 ENCOUNTER — Inpatient Hospital Stay (HOSPITAL_COMMUNITY): Payer: BC Managed Care – PPO

## 2013-08-17 DIAGNOSIS — B952 Enterococcus as the cause of diseases classified elsewhere: Secondary | ICD-10-CM

## 2013-08-17 DIAGNOSIS — A689 Relapsing fever, unspecified: Secondary | ICD-10-CM

## 2013-08-17 DIAGNOSIS — N390 Urinary tract infection, site not specified: Secondary | ICD-10-CM

## 2013-08-17 DIAGNOSIS — D72829 Elevated white blood cell count, unspecified: Secondary | ICD-10-CM

## 2013-08-17 DIAGNOSIS — N10 Acute tubulo-interstitial nephritis: Secondary | ICD-10-CM

## 2013-08-17 LAB — CBC
HCT: 35.4 % — ABNORMAL LOW (ref 39.0–52.0)
HEMOGLOBIN: 12.1 g/dL — AB (ref 13.0–17.0)
MCH: 29.9 pg (ref 26.0–34.0)
MCHC: 34.2 g/dL (ref 30.0–36.0)
MCV: 87.4 fL (ref 78.0–100.0)
Platelets: 251 10*3/uL (ref 150–400)
RBC: 4.05 MIL/uL — ABNORMAL LOW (ref 4.22–5.81)
RDW: 14.2 % (ref 11.5–15.5)
WBC: 13.2 10*3/uL — ABNORMAL HIGH (ref 4.0–10.5)

## 2013-08-17 LAB — URINE MICROSCOPIC-ADD ON

## 2013-08-17 LAB — CULTURE, BLOOD (ROUTINE X 2)
Culture: NO GROWTH
Culture: NO GROWTH

## 2013-08-17 LAB — BASIC METABOLIC PANEL
BUN: 17 mg/dL (ref 6–23)
CHLORIDE: 93 meq/L — AB (ref 96–112)
CO2: 28 mEq/L (ref 19–32)
CREATININE: 1.43 mg/dL — AB (ref 0.50–1.35)
Calcium: 8.6 mg/dL (ref 8.4–10.5)
GFR calc Af Amer: 61 mL/min — ABNORMAL LOW (ref >90)
GFR calc non Af Amer: 53 mL/min — ABNORMAL LOW (ref >90)
GLUCOSE: 118 mg/dL — AB (ref 70–99)
POTASSIUM: 4 meq/L (ref 3.7–5.3)
Sodium: 131 mEq/L — ABNORMAL LOW (ref 137–147)

## 2013-08-17 LAB — URINALYSIS, ROUTINE W REFLEX MICROSCOPIC
GLUCOSE, UA: NEGATIVE mg/dL
Ketones, ur: NEGATIVE mg/dL
Nitrite: NEGATIVE
Protein, ur: 30 mg/dL — AB
Specific Gravity, Urine: 1.017 (ref 1.005–1.030)
Urobilinogen, UA: 4 mg/dL — ABNORMAL HIGH (ref 0.0–1.0)
pH: 6.5 (ref 5.0–8.0)

## 2013-08-17 MED ORDER — ACETAMINOPHEN 325 MG PO TABS
650.0000 mg | ORAL_TABLET | Freq: Four times a day (QID) | ORAL | Status: DC | PRN
Start: 2013-08-17 — End: 2013-08-20
  Administered 2013-08-17 – 2013-08-18 (×2): 650 mg via ORAL
  Filled 2013-08-17 (×2): qty 2

## 2013-08-17 MED ORDER — IBUPROFEN 800 MG PO TABS: 800.0000 mg | ORAL_TABLET | Freq: Three times a day (TID) | ORAL | Status: DC | PRN

## 2013-08-17 MED ORDER — WHITE PETROLATUM GEL
Freq: Two times a day (BID) | Status: DC
  Administered 2013-08-17 – 2013-08-18 (×2): via TOPICAL
  Administered 2013-08-18 – 2013-08-20 (×4): 1 via TOPICAL
  Filled 2013-08-17 (×8): qty 5

## 2013-08-17 MED ORDER — SALINE SPRAY 0.65 % NA SOLN
1.0000 | NASAL | Status: DC | PRN
  Filled 2013-08-17: qty 44

## 2013-08-17 NOTE — Progress Notes (Signed)
ID: Edward [name] is a 58 year old who is well known to me for extensive nephrolithiasis. Most recently he had bilateral ureteroscopy, right stent removal, left ureteroscopy/laser lithotripsy and left ureteral stent placement. This was performed on 08/05/13. The patient was discharged home. He was doing well initially and then presented to the clinic in extremis. He was subsequently admitted and found to have a urinary tract infection.    Interval: Stent removed, no complaints this AM regarding flank pain No fevers p 24 hrs   Scheduled Meds: . antiseptic oral rinse  15 mL Mouth Rinse BID  . ciprofloxacin  500 mg Oral BID  . citalopram  40 mg Oral q morning - 10a  . enoxaparin (LOVENOX) injection  60 mg Subcutaneous QHS  . metoprolol succinate  50 mg Oral QHS  . pantoprazole  40 mg Oral Daily  . polyethylene glycol  17 g Oral Daily  . simvastatin  40 mg Oral QHS   Continuous Infusions: . sodium chloride 10 mL/hr at 08/14/13 0718   PRN Meds:.acetaminophen, docusate sodium, HYDROmorphone (DILAUDID) injection, ketorolac, lactulose, ondansetron (ZOFRAN) IV, ondansetron, oxyCODONE, zolpidem  Patient Active Problem List   Diagnosis Date Noted  . Acute respiratory failure with hypoxia 08/14/2013  . Sepsis 08/11/2013  . Nephrolithiasis 07/22/2013  . Renal calculi 07/22/2013   Past Medical History  Diagnosis Date  . Hypertension   . Hyperlipidemia   . Fibromyalgia   . GERD (gastroesophageal reflux disease)   . History of kidney stones     LONG HX AND CURRENTLY PASSED STONES ALL THE TIME  . Depression   . Leg pain, bilateral     aching of both legs - chronic  . OSA on CPAP   . History of hepatitis     CHILDHOOD-- UNKNOWN TYPE  . Renal calculus, left     MULTIPLE  . Wears contact lenses    Past Surgical History  Procedure Laterality Date  . Cystoscopy with retrograde pyelogram, ureteroscopy and stent placement Bilateral 07/22/2013    Procedure: CYSTOSCOPY WITH LEFT AND RIGHT   URETEROSCOPY ,HOLMIUM LASER LITHTRIPSY,  STENT PLACEMENT, RIGHT AND LEFT  RETROGRAM PYELOGRAM ;  Surgeon: Edward Fat, MD;  Location: [facility];  Service: Urology;  Laterality: Bilateral;  . Nephrolithotomy Right 07/22/2013    Procedure: RIGHT PERCUTANEOUS NEPHROLITHOTOMY SURGEON ACCESS ;  Surgeon: Edward Fat, MD;  Location: [facility];  Service: Urology;  Laterality: Right;  . Holmium laser application Bilateral 07/22/2013    Procedure: HOLMIUM LASER APPLICATION;  Surgeon: Edward Fat, MD;  Location: [facility];  Service: Urology;  Laterality: Bilateral;  . Right ureteroscopic stone extraction  02-17-2000  . Percutaneous nephrostolithotomy Right 06-02-2008  . Extracorporeal shock wave lithotripsy  MULTIPLE  . Multiple cysto/ ureteoscopic stone extractions  PRIOR TO 2001  . Appendectomy  1970's  . Cholecystectomy  2001  . Cystoscopy with retrograde pyelogram, ureteroscopy and stent placement Bilateral 08/05/2013    Procedure: BILATERAL URETEROSCOPY WITH BILATERAL RETROGRADE PYELOGRAM, RIGHT STENT REMOVAL, LEFT LASER LITHOTRIPSY  AND LEFT URETEDRAL STENT EXCHANGE;  Surgeon: Edward Fat, MD;  Location: [facility];  Service: Urology;  Laterality: Bilateral;  . Holmium laser application Left 08/05/2013    Procedure: HOLMIUM LASER APPLICATION;  Surgeon: Edward Fat, MD;  Location[facility];  Service: Urology;  Laterality: Left;    PE; NAD Filed Vitals:   08/15/13 2209 08/16/13 0558 08/16/13 0631 08/16/13 1502  BP: 146/77 165/77  137/79  Pulse: 62 77  81  Temp: 97.8 F (36.6 C) 98.4 F (36.9 C)  99.5 F (37.5 C)  TempSrc: Oral Oral  Oral  Resp: 18 18  16   Height:      Weight:   116.6 kg (257 lb 0.9 oz)   SpO2: 95% 93%  95%   No CVA tenderness, abdomen soft   Recent Labs  08/15/13 0505 08/16/13 0525  WBC 12.2* 10.5  HGB 12.8* 12.3*  HCT 38.0* 36.9*    Recent Labs  08/15/13 0505 08/16/13 0525  NA 135* 131*  K 3.6* 3.5*   CL 94* 92*  CO2 32 29  GLUCOSE 108* 139*  BUN 14 20  CREATININE 1.32 1.32  CALCIUM 8.9 8.8   No results found for this basename: LABPT, INR,  in the last 72 hours No results found for this basename: LABURIN,  in the last 72 hours Results for orders placed during the hospital encounter of 08/11/13  CULTURE, BLOOD (ROUTINE X 2)     Status: None   Collection Time    08/11/13  1:32 PM      Result Value Ref Range Status   Specimen Description BLOOD RIGHT WRIST   Final   Special Requests BOTTLES DRAWN AEROBIC AND ANAEROBIC 5CC EACH   Final   Culture  Setup Time     Final   Value: 08/11/2013 16:10     Performed at [facility]   Culture     Final   Value:        BLOOD CULTURE RECEIVED NO GROWTH TO DATE CULTURE WILL BE HELD FOR 5 DAYS BEFORE ISSUING A FINAL NEGATIVE REPORT     Performed at [facility]   Report Status PENDING   Incomplete  CULTURE, BLOOD (ROUTINE X 2)     Status: None   Collection Time    08/11/13  1:37 PM      Result Value Ref Range Status   Specimen Description BLOOD RIGHT HAND   Final   Special Requests BOTTLES DRAWN AEROBIC AND ANAEROBIC 5CC EACH   Final   Culture  Setup Time     Final   Value: 08/11/2013 16:09     Performed at [facility]   Culture     Final   Value:        BLOOD CULTURE RECEIVED NO GROWTH TO DATE CULTURE WILL BE HELD FOR 5 DAYS BEFORE ISSUING A FINAL NEGATIVE REPORT     Performed at [facility]   Report Status PENDING   Incomplete  URINE CULTURE     Status: None   Collection Time    08/11/13  3:43 PM      Result Value Ref Range Status   Specimen Description URINE, CLEAN CATCH   Final   Special Requests NONE   Final   Culture  Setup Time     Final   Value: 08/11/2013 22:40     Performed at [facility] Count     Final   Value: >=100,000 COLONIES/ML     Performed at [facility]   Culture     Final   Value: ENTEROCOCCUS SPECIES     Performed at [facility]   Report  Status 08/13/2013 FINAL   Final   Organism ID, Bacteria ENTEROCOCCUS SPECIES   Final  MRSA PCR SCREENING     Status: None   Collection Time    08/11/13  5:24 PM      Result Value Ref  Range Status   MRSA by PCR NEGATIVE  NEGATIVE Final   Comment:            The GeneXpert MRSA Assay (FDA     approved for NASAL specimens     only), is one component of a     comprehensive MRSA colonization     surveillance program. It is not     intended to diagnose MRSA     infection nor to guide or     monitor treatment for     MRSA infections.   CT ab/pelv: I've independently reviewed the images. The patient has chronically dilated right collecting system, there is no evidence of obstruction on the right side. Patient has a left ureteral stent placed which is appropriately positioned. The patient has persistent stones within the left renal pelvis. There no significant other findings.  Impression: Complicated enterococcus UTI/ Pyelonephritis - resolving Likely sinus infection/URI  Recommendations: F/u labs today to ensure renal function unchanged after stent removal Cipro x 10 days. Patient scheduled in office in 6 weeks with a renal ultrasound.

## 2013-08-17 NOTE — Progress Notes (Signed)
[facility] PROGRESS NOTE  Edward [name] ZDG:[id number] DOB: 1956-06-08 DOA: 08/11/2013 PCP: Loleta Dicker, FNP  Assessment/Plan  SIRS/Sepsis likely secondary to pyelonephritis. UCx growing enterococcus s. To FQ. He had not been taking cipro prior to admission even though this was listed on his home medication list.  - off vanc and cefepime  - BCx NGTD  - Started ciprofloxacin 2/22  -  Developed recurrent fever overnight on 2/24>2/25 -  May be secondary to URI:  Check flu PCR -  Repeat UA:  appears improved from prior (decreased leukocyte esterase and WBC down to 7-10 from 21-50.   -  F/u repeat urine culture -  Repeat CXR:  Atelectasis -  Monitor for C. Diff diarrhea   Headaches, left temporal.  No photophobia or nuchal rigidity.  Description sounds a little like cluster headache but without tearing or runny nose.  Only lasts for a split second, then resolves.  No sinus pressure.  May be rebound headache after stoppin mobic. -  Ibuprofen prn HA -  D/c toradol  Right hydronephrosis  - stable per urology, voiding without difficulty.  -  Creatinine rose slightly today -  Repeat creatinine in AM  Acute hypoxic respiratory failure, resolved.  Was likely secondary to acute on chronic diastolic heart failure, but cannot exclude acute COPD exac given hx of tobacco abuse  - CXR with developing vascular congestion, stopped IVF 2/22  - weight 256 lbs this AM  - place on Lasix if weight trending up   Hypertension, Previous hypotension now resolved.  - Continue BB  - Hold diuretic due to recent AKI but if weight trending up, consider starting low dose Lasix   Hypokalemia, resolved with oral supplementation  Acute renal failure, creatinine increased this AM - d/c toradol -  Continue to hold mobic - rare use of prn ibuprofen for headache  Leukocytosis initially improved with Abx, but increased today -  Repeat in AM  Right peritracheal prominence  - CT chest with contrast will have  to wait due to acute renal failure but can be obtained in an outpatient setting    Diet:  regular Access:  PIV IVF:  Yes Proph:  lovenox  Code Status: full Family Communication: patient  Disposition Plan: pending further evaluation for etiology of fever   Consultants:  Urology Procedures/Studies:  Ct Abdomen Pelvis Wo Contrast 08/11/2013 Bilateral renal calculi. Left ureteral stent without left hydronephrosis. Mild right hydronephrosis and hydroureter.  Dg Chest Port 1 View 08/11/2013 Right peritracheal prominence is seen concerning for possible neoplasm or adenopathy Antibiotics:  Vancomycin 2/19 --> 2/22  Maxipime 2/19 -->2/22 Cipro 2/22 >>  HPI/Subjective:  Mild epistaxis.  Has some sinus congestion and chest congestion/cough.    Objective: Filed Vitals:   08/17/13 0614 08/17/13 0745 08/17/13 1414 08/17/13 1422  BP: 144/82  147/73   Pulse: 91  88   Temp: 101.7 F (38.7 C) 98.9 F (37.2 C)  100 F (37.8 C)  TempSrc: Oral Oral  Oral  Resp: 18  20   Height:      Weight: 116.8 kg (257 lb 8 oz)     SpO2: 93%  94%    No intake or output data in the 24 hours ending 08/17/13 1531 Filed Weights   08/15/13 0859 08/16/13 0631 08/17/13 0614  Weight: 116.4 kg (256 lb 9.9 oz) 116.6 kg (257 lb 0.9 oz) 116.8 kg (257 lb 8 oz)    Exam:   General:  CM, No acute distress  HEENT:  NCAT, MMM, right nare with some blood on the septum, dry and macerated  Cardiovascular:  RRR, nl S1, S2 no mrg, 2+ pulses, warm extremities  Respiratory:  rhonchorous cough, no focal wheezes or rhonchi, no increased WOB  Abdomen:   NABS, soft, NT/ND  MSK:   Normal tone and bulk, no LEE  Neuro:  Grossly intact  Data Reviewed: Basic Metabolic Panel:  Recent Labs Lab 08/11/13 1352 08/11/13 1840  08/13/13 0321 08/14/13 0328 08/15/13 0505 08/16/13 0525 08/17/13 0545  NA 135* 134*  < > 135* 135* 135* 131* 131*  K 3.0* 4.5  < > 3.7 3.6* 3.6* 3.5* 4.0  CL 98 99  < > 100 100 94* 92* 93*   CO2  --  23  < > 23 25 32 29 28  GLUCOSE 189* 150*  < > 165* 129* 108* 139* 118*  BUN 23 23  < > 22 17 14 20 17   CREATININE 2.00* 1.68*  < > 1.49* 1.15 1.32 1.32 1.43*  CALCIUM  --  8.5  < > 8.6 8.4 8.9 8.8 8.6  MG  --  1.6  --   --   --   --   --   --   PHOS  --  2.3  --   --   --   --   --   --   < > = values in this interval not displayed. Liver Function Tests:  Recent Labs Lab 08/11/13 1341  AST 36  ALT 43  ALKPHOS 167*  BILITOT 1.1  PROT 7.7  ALBUMIN 3.2*   No results found for this basename: LIPASE, AMYLASE,  in the last 168 hours No results found for this basename: AMMONIA,  in the last 168 hours CBC:  Recent Labs Lab 08/11/13 1341  08/13/13 0321 08/14/13 0328 08/15/13 0505 08/16/13 0525 08/17/13 0545  WBC 29.7*  < > 14.6* 10.1 12.2* 10.5 13.2*  NEUTROABS 26.1*  --   --   --   --   --   --   HGB 15.1  < > 12.1* 12.0* 12.8* 12.3* 12.1*  HCT 43.0  < > 36.2* 35.4* 38.0* 36.9* 35.4*  MCV 88.5  < > 89.6 88.1 88.2 88.1 87.4  PLT 251  < > 181 177 192 210 251  < > = values in this interval not displayed. Cardiac Enzymes: No results found for this basename: CKTOTAL, CKMB, CKMBINDEX, TROPONINI,  in the last 168 hours BNP (last 3 results)  Recent Labs  08/14/13 0328  PROBNP 534.8*   CBG: No results found for this basename: GLUCAP,  in the last 168 hours  Recent Results (from the past 240 hour(s))  CULTURE, BLOOD (ROUTINE X 2)     Status: None   Collection Time    08/11/13  1:32 PM      Result Value Ref Range Status   Specimen Description BLOOD RIGHT WRIST   Final   Special Requests BOTTLES DRAWN AEROBIC AND ANAEROBIC [facility] EACH   Final   Culture  Setup Time     Final   Value: 08/11/2013 16:10     Performed at [facility]   Culture     Final   Value: NO GROWTH 5 DAYS     Performed at [facility]   Report Status 08/17/2013 FINAL   Final  CULTURE, BLOOD (ROUTINE X 2)     Status: None   Collection Time    08/11/13  1:37 PM  Result  Value Ref Range Status   Specimen Description BLOOD RIGHT HAND   Final   Special Requests BOTTLES DRAWN AEROBIC AND ANAEROBIC [facility] EACH   Final   Culture  Setup Time     Final   Value: 08/11/2013 16:09     Performed at [facility]   Culture     Final   Value: NO GROWTH 5 DAYS     Performed at [facility]   Report Status 08/17/2013 FINAL   Final  URINE CULTURE     Status: None   Collection Time    08/11/13  3:43 PM      Result Value Ref Range Status   Specimen Description URINE, CLEAN CATCH   Final   Special Requests NONE   Final   Culture  Setup Time     Final   Value: 08/11/2013 22:40     Performed at [facility] Count     Final   Value: >=100,000 COLONIES/ML     Performed at [facility]   Culture     Final   Value: ENTEROCOCCUS SPECIES     Performed at [facility]   Report Status 08/13/2013 FINAL   Final   Organism ID, Bacteria ENTEROCOCCUS SPECIES   Final  MRSA PCR SCREENING     Status: None   Collection Time    08/11/13  5:24 PM      Result Value Ref Range Status   MRSA by PCR NEGATIVE  NEGATIVE Final   Comment:            The GeneXpert MRSA Assay (FDA     approved for NASAL specimens     only), is one component of a     comprehensive MRSA colonization     surveillance program. It is not     intended to diagnose MRSA     infection nor to guide or     monitor treatment for     MRSA infections.     Studies: Dg Chest Port 1 View  08/17/2013   CLINICAL DATA:  New onset of fever.  EXAM: PORTABLE CHEST - 1 VIEW  COMPARISON:  DG CHEST 1V PORT dated 08/14/2013; DG CHEST 1V PORT dated 08/11/2013  FINDINGS: Heart size is deemed to be upper normal given the lung volumes and AP projection. Suspected subsegmental atelectasis in the left lower lobe. Lungs appear otherwise clear. No pleural effusion observed.  IMPRESSION: 1. Subsegmental atelectasis, left lower lobe.  Otherwise negative.   Electronically Signed   By: Herbie Baltimore M.D.   On: 08/17/2013 08:04    Scheduled Meds: . antiseptic oral rinse  15 mL Mouth Rinse BID  . ciprofloxacin  500 mg Oral BID  . citalopram  40 mg Oral q morning - 10a  . enoxaparin (LOVENOX) injection  60 mg Subcutaneous QHS  . metoprolol succinate  50 mg Oral QHS  . pantoprazole  40 mg Oral Daily  . polyethylene glycol  17 g Oral Daily  . simvastatin  40 mg Oral QHS  . white petrolatum   Topical BID   Continuous Infusions: . sodium chloride 10 mL/hr at 08/14/13 0981    Active Problems:   Sepsis   Acute respiratory failure with hypoxia    Time spent: 30 min    Edward [name], [facility]  [facility] Pager [phone]. If 7PM-7AM, please contact night-coverage at [email or web address], password [facility] 08/17/2013, 3:31 PM  LOS: 6 days

## 2013-08-18 DIAGNOSIS — D72829 Elevated white blood cell count, unspecified: Secondary | ICD-10-CM

## 2013-08-18 LAB — BASIC METABOLIC PANEL
BUN: 18 mg/dL (ref 6–23)
CO2: 27 mEq/L (ref 19–32)
Calcium: 8.6 mg/dL (ref 8.4–10.5)
Chloride: 94 mEq/L — ABNORMAL LOW (ref 96–112)
Creatinine, Ser: 1.41 mg/dL — ABNORMAL HIGH (ref 0.50–1.35)
GFR calc Af Amer: 62 mL/min — ABNORMAL LOW (ref >90)
GFR calc non Af Amer: 54 mL/min — ABNORMAL LOW (ref >90)
GLUCOSE: 121 mg/dL — AB (ref 70–99)
POTASSIUM: 4 meq/L (ref 3.7–5.3)
SODIUM: 132 meq/L — AB (ref 137–147)

## 2013-08-18 LAB — CBC
HCT: 35 % — ABNORMAL LOW (ref 39.0–52.0)
HEMOGLOBIN: 11.7 g/dL — AB (ref 13.0–17.0)
MCH: 29.3 pg (ref 26.0–34.0)
MCHC: 33.4 g/dL (ref 30.0–36.0)
MCV: 87.7 fL (ref 78.0–100.0)
Platelets: 304 10*3/uL (ref 150–400)
RBC: 3.99 MIL/uL — AB (ref 4.22–5.81)
RDW: 14.3 % (ref 11.5–15.5)
WBC: 15.5 10*3/uL — ABNORMAL HIGH (ref 4.0–10.5)

## 2013-08-18 LAB — INFLUENZA PANEL BY PCR (TYPE A & B)
H1N1 flu by pcr: NOT DETECTED
INFLAPCR: NEGATIVE
Influenza B By PCR: NEGATIVE

## 2013-08-18 LAB — URINE CULTURE
CULTURE: NO GROWTH
Colony Count: NO GROWTH

## 2013-08-18 MED ORDER — SENNA 8.6 MG PO TABS
2.0000 | ORAL_TABLET | Freq: Every day | ORAL | Status: DC
  Administered 2013-08-18: 17.2 mg via ORAL
  Filled 2013-08-18: qty 2

## 2013-08-18 MED ORDER — BISACODYL 5 MG PO TBEC: 5.0000 mg | DELAYED_RELEASE_TABLET | Freq: Every day | ORAL | Status: DC | PRN

## 2013-08-18 NOTE — Progress Notes (Signed)
ID: [name] is a 58 year old who is well known to me for extensive nephrolithiasis. Most recently he had bilateral ureteroscopy, right stent removal, left ureteroscopy/laser lithotripsy and left ureteral stent placement. This was performed on 08/05/13. The patient was discharged home. He was doing well initially and then presented to the clinic in extremis. He was subsequently admitted and found to have a urinary tract infection.    Interval: No acute events Feels better this AM  Scheduled Meds: . antiseptic oral rinse  15 mL Mouth Rinse BID  . ciprofloxacin  500 mg Oral BID  . citalopram  40 mg Oral q morning - 10a  . enoxaparin (LOVENOX) injection  60 mg Subcutaneous QHS  . metoprolol succinate  50 mg Oral QHS  . pantoprazole  40 mg Oral Daily  . polyethylene glycol  17 g Oral Daily  . simvastatin  40 mg Oral QHS  . white petrolatum   Topical BID   Continuous Infusions: . sodium chloride 50 mL/hr at 08/17/13 2046   PRN Meds:.acetaminophen, docusate sodium, ibuprofen, lactulose, ondansetron (ZOFRAN) IV, ondansetron, oxyCODONE, sodium chloride, zolpidem  Patient Active Problem List   Diagnosis Date Noted  . Enterococcus UTI 08/17/2013  . Recurrent fever 08/17/2013  . Acute pyelonephritis 08/17/2013  . Leukocytosis, unspecified 08/17/2013  . Acute respiratory failure with hypoxia 08/14/2013  . Severe sepsis 08/11/2013  . Nephrolithiasis 07/22/2013  . Renal calculi 07/22/2013   Past Medical History  Diagnosis Date  . Hypertension   . Hyperlipidemia   . Fibromyalgia   . GERD (gastroesophageal reflux disease)   . History of kidney stones     LONG HX AND CURRENTLY PASSED STONES ALL THE TIME  . Depression   . Leg pain, bilateral     aching of both legs - chronic  . OSA on CPAP   . History of hepatitis     CHILDHOOD-- UNKNOWN TYPE  . Renal calculus, left     MULTIPLE  . Wears contact lenses    Past Surgical History  Procedure Laterality Date  . Cystoscopy with  retrograde pyelogram, ureteroscopy and stent placement Bilateral 07/22/2013    Procedure: CYSTOSCOPY WITH LEFT AND RIGHT  URETEROSCOPY ,HOLMIUM LASER LITHTRIPSY,  STENT PLACEMENT, RIGHT AND LEFT  RETROGRAM PYELOGRAM ;  Surgeon: Crist FatBenjamin W Caige Almeda, MD;  Location: [facility];  Service: Urology;  Laterality: Bilateral;  . Nephrolithotomy Right 07/22/2013    Procedure: RIGHT PERCUTANEOUS NEPHROLITHOTOMY SURGEON ACCESS ;  Surgeon: Crist FatBenjamin W Pascuala Klutts, MD;  Location: [facility];  Service: Urology;  Laterality: Right;  . Holmium laser application Bilateral 07/22/2013    Procedure: HOLMIUM LASER APPLICATION;  Surgeon: Crist FatBenjamin W Keimari Sterling, MD;  Location: [facility];  Service: Urology;  Laterality: Bilateral;  . Right ureteroscopic stone extraction  02-17-2000  . Percutaneous nephrostolithotomy Right 06-02-2008  . Extracorporeal shock wave lithotripsy  MULTIPLE  . Multiple cysto/ ureteoscopic stone extractions  PRIOR TO 2001  . Appendectomy  1970's  . Cholecystectomy  2001  . Cystoscopy with retrograde pyelogram, ureteroscopy and stent placement Bilateral 08/05/2013    Procedure: BILATERAL URETEROSCOPY WITH BILATERAL RETROGRADE PYELOGRAM, RIGHT STENT REMOVAL, LEFT LASER LITHOTRIPSY  AND LEFT URETEDRAL STENT EXCHANGE;  Surgeon: Crist FatBenjamin W Abdel Effinger, MD;  [facility];  Service: Urology;  Laterality: Bilateral;  . Holmium laser application Left 08/05/2013    Procedure: HOLMIUM LASER APPLICATION;  Surgeon: Crist FatBenjamin W Aysha Livecchi, MD;  [facility] [place];  Service: Urology;  Laterality: Left;    PE; NAD Filed Vitals:  08/17/13 1613 08/17/13 1808 08/17/13 2108 08/18/13 0519  BP:   142/82 121/66  Pulse:   97 87  Temp: 100.5 F (38.1 C) 98.3 F (36.8 C) 98.9 F (37.2 C) 98.6 F (37 C)  TempSrc: Oral Oral Oral Oral  Resp:   16 18  Height:      Weight:      SpO2:   97% 92%   No CVA tenderness, abdomen soft   Recent Labs  08/16/13 0525 08/17/13 0545 08/18/13 0520  WBC 10.5  13.2* 15.5*  HGB 12.3* 12.1* 11.7*  HCT 36.9* 35.4* 35.0*    Recent Labs  08/16/13 0525 08/17/13 0545 08/18/13 0520  NA 131* 131* 132*  K 3.5* 4.0 4.0  CL 92* 93* 94*  CO2 29 28 27   GLUCOSE 139* 118* 121*  BUN 20 17 18   CREATININE 1.32 1.43* 1.41*  CALCIUM 8.8 8.6 8.6   No results found for this basename: LABPT, INR,  in the last 72 hours No results found for this basename: LABURIN,  in the last 72 hours Results for orders placed during the hospital encounter of 08/11/13  CULTURE, BLOOD (ROUTINE X 2)     Status: None   Collection Time    08/11/13  1:32 PM      Result Value Ref Range Status   Specimen Description BLOOD RIGHT WRIST   Final   Special Requests BOTTLES DRAWN AEROBIC AND ANAEROBIC 5CC EACH   Final   Culture  Setup Time     Final   Value: 08/11/2013 16:10     Performed at [facility]   Culture     Final   Value: NO GROWTH 5 DAYS     Performed at [facility]   Report Status 08/17/2013 FINAL   Final  CULTURE, BLOOD (ROUTINE X 2)     Status: None   Collection Time    08/11/13  1:37 PM      Result Value Ref Range Status   Specimen Description BLOOD RIGHT HAND   Final   Special Requests BOTTLES DRAWN AEROBIC AND ANAEROBIC [facility] EACH   Final   Culture  Setup Time     Final   Value: 08/11/2013 16:09     Performed at [facility]   Culture     Final   Value: NO GROWTH 5 DAYS     Performed at [facility]   Report Status 08/17/2013 FINAL   Final  URINE CULTURE     Status: None   Collection Time    08/11/13  3:43 PM      Result Value Ref Range Status   Specimen Description URINE, CLEAN CATCH   Final   Special Requests NONE   Final   Culture  Setup Time     Final   Value: 08/11/2013 22:40     Performed at [facility] Count     Final   Value: >=100,000 COLONIES/ML     Performed at [facility]   Culture     Final   Value: ENTEROCOCCUS SPECIES     Performed at [facility]   Report Status  08/13/2013 FINAL   Final   Organism ID, Bacteria ENTEROCOCCUS SPECIES   Final  MRSA PCR SCREENING     Status: None   Collection Time    08/11/13  5:24 PM      Result Value Ref Range Status   MRSA by PCR NEGATIVE  NEGATIVE  Final   Comment:            The GeneXpert MRSA Assay (FDA     approved for NASAL specimens     only), is one component of a     comprehensive MRSA colonization     surveillance program. It is not     intended to diagnose MRSA     infection nor to guide or     monitor treatment for     MRSA infections.   CT ab/pelv: I've independently reviewed the images. The patient has chronically dilated right collecting system, there is no evidence of obstruction on the right side. Patient has a left ureteral stent placed which is appropriately positioned. The patient has persistent stones within the left renal pelvis. There no significant other findings.  Impression: Complicated enterococcus UTI/ Pyelonephritis - resolving Likely sinus infection/URI  Recommendations: No new urologic recommendations Cipro x 10 days. Patient scheduled in office in 6 weeks with a renal ultrasound.

## 2013-08-18 NOTE — Progress Notes (Signed)
Patient noted to have home CPAP equipment at the bedside, which he states he has been using since his admission this visit. Previous notes consistent with this information. Order placed in computer for home equipment use. Biomed check previously completed and equipment tagged. Humidifier filled with sterile water. Equipment powered by emergency outlet (red). Patient is encouraged to call for assistance if needed at any time.

## 2013-08-18 NOTE — Progress Notes (Signed)
[facility] PROGRESS NOTE  Edward [name] ZOX:[id number] DOB: 1956-03-19 DOA: 08/11/2013 PCP: Loleta Dicker, FNP  Assessment/Plan  SIRS/Sepsis likely secondary to pyelonephritis. UCx growing enterococcus s. To FQ.  Recurrent fever 2/25. -  off vanc and cefepime  -  BCx NGTD  -  Started ciprofloxacin 2/22  -  May be secondary to URI:  Check flu PCR -  Repeat UA:  appears improved from prior (decreased leukocyte esterase and WBC down to 7-10 from 21-50.   -  F/u repeat urine culture (anticipate result on 2/27) -  Repeat CXR:  Atelectasis -  Monitor for C. Diff diarrhea   Headaches, left temporal.  No photophobia or nuchal rigidity.  Description sounds a little like cluster headache but without tearing or runny nose.  Only lasts for a split second, then resolves.  No sinus pressure.  May be rebound headache after stoppin mobic.  resolving.   -  Ibuprofen prn HA  Right hydronephrosis  - stable per urology, voiding without difficulty.  -  F/u urology in 6 weeks for repeat renal US  Acute hypoxic respiratory failure, resolved.  Was likely secondary to acute on chronic diastolic heart failure, but cannot exclude acute COPD exac given hx of tobacco abuse  - CXR with developing vascular congestion, stopped IVF 2/22  - weight 256 lbs this AM  - place on Lasix if weight trending up   Hypertension, Previous hypotension now resolved.  - Continue BB  - Hold diuretic due to recent AKI but if weight trending up, consider starting low dose Lasix   Hypokalemia, resolved with oral supplementation  Acute renal failure, creatinine stable.   -  Minimize NSAIDS  Leukocytosis initially improved with Abx, continuing to increase, worrisome for underlying infection  -  Repeat in AM  Right peritracheal prominence  - CT chest with contrast will have to wait due to acute renal failure but can be obtained in an outpatient setting   Constipation -  Continue colace, miralax, lactulose -  Add senna  scheduled with prn bisacodyl  Diet:  regular Access:  PIV IVF:  Yes Proph:  lovenox  Code Status: full Family Communication: patient  Disposition Plan:  Discharge tomorrow if fevers trending down, blood and urine cultures neg/reported, creatinine stable, WBC improving   Consultants:  Urology Procedures/Studies:  Ct Abdomen Pelvis Wo Contrast 08/11/2013 Bilateral renal calculi. Left ureteral stent without left hydronephrosis. Mild right hydronephrosis and hydroureter.  Dg Chest Port 1 View 08/11/2013 Right peritracheal prominence is seen concerning for possible neoplasm or adenopathy Antibiotics:  Vancomycin 2/19 --> 2/22  Maxipime 2/19 -->2/22 Cipro 2/22 >>  HPI/Subjective:  Mild epistaxis.  Has some sinus congestion and chest congestion/cough which are improving.    Objective: Filed Vitals:   08/17/13 1613 08/17/13 1808 08/17/13 2108 08/18/13 0519  BP:   142/82 121/66  Pulse:   97 87  Temp: 100.5 F (38.1 C) 98.3 F (36.8 C) 98.9 F (37.2 C) 98.6 F (37 C)  TempSrc: Oral Oral Oral Oral  Resp:   16 18  Height:      Weight:      SpO2:   97% 92%    Intake/Output Summary (Last 24 hours) at 08/18/13 1350 Last data filed at 08/18/13 0203  Gross per 24 hour  Intake      0 ml  Output    700 ml  Net   -700 ml   Filed Weights   08/15/13 0859 08/16/13 0631 08/17/13 0614  Weight: 116.4 kg (  256 lb 9.9 oz) 116.6 kg (257 lb 0.9 oz) 116.8 kg (257 lb 8 oz)    Exam:   General:  CM, No acute distress  HEENT:  NCAT, MMM  Cardiovascular:  RRR, nl S1, S2 no mrg, 2+ pulses, warm extremities  Respiratory:  rhonchorous cough, no focal wheezes or rhonchi, no increased WOB  Abdomen:   NABS, soft, NT/ND  MSK:   Normal tone and bulk, no LEE  Neuro:  Grossly intact  Data Reviewed: Basic Metabolic Panel:  Recent Labs Lab 08/11/13 1352 08/11/13 1840  08/14/13 0328 08/15/13 0505 08/16/13 0525 08/17/13 0545 08/18/13 0520  NA 135* 134*  < > 135* 135* 131* 131* 132*  K  3.0* 4.5  < > 3.6* 3.6* 3.5* 4.0 4.0  CL 98 99  < > 100 94* 92* 93* 94*  CO2  --  23  < > 25 32 29 28 27   GLUCOSE 189* 150*  < > 129* 108* 139* 118* 121*  BUN 23 23  < > 17 14 20 17 18   CREATININE 2.00* 1.68*  < > 1.15 1.32 1.32 1.43* 1.41*  CALCIUM  --  8.5  < > 8.4 8.9 8.8 8.6 8.6  MG  --  1.6  --   --   --   --   --   --   PHOS  --  2.3  --   --   --   --   --   --   < > = values in this interval not displayed. Liver Function Tests: No results found for this basename: AST, ALT, ALKPHOS, BILITOT, PROT, ALBUMIN,  in the last 168 hours No results found for this basename: LIPASE, AMYLASE,  in the last 168 hours No results found for this basename: AMMONIA,  in the last 168 hours CBC:  Recent Labs Lab 08/14/13 0328 08/15/13 0505 08/16/13 0525 08/17/13 0545 08/18/13 0520  WBC 10.1 12.2* 10.5 13.2* 15.5*  HGB 12.0* 12.8* 12.3* 12.1* 11.7*  HCT 35.4* 38.0* 36.9* 35.4* 35.0*  MCV 88.1 88.2 88.1 87.4 87.7  PLT 177 192 210 251 304   Cardiac Enzymes: No results found for this basename: CKTOTAL, CKMB, CKMBINDEX, TROPONINI,  in the last 168 hours BNP (last 3 results)  Recent Labs  08/14/13 0328  PROBNP 534.8*   CBG: No results found for this basename: GLUCAP,  in the last 168 hours  Recent Results (from the past 240 hour(s))  CULTURE, BLOOD (ROUTINE X 2)     Status: None   Collection Time    08/11/13  1:32 PM      Result Value Ref Range Status   Specimen Description BLOOD RIGHT WRIST   Final   Special Requests BOTTLES DRAWN AEROBIC AND ANAEROBIC [facility]5CC EACH   Final   Culture  Setup Time     Final   Value: 08/11/2013 16:10     [facility]formed [facility]   Culture     Final   Value: NO GROWTH 5 DAYS     Performed at [facility]   Report Status 08/17/2013 FINAL   Final  CULTURE, BLOOD (ROUTINE X 2)     Status: None   Collection Time    08/11/13  1:37 PM      Result Value Ref Range Status   Specimen Description BLOOD RIGHT HAND   Final   Special Requests  BOTTLES DRAWN AEROBIC AND ANAEROBIC 5CC EACH   Final   Culture  Setup Time  Final   Value: 08/11/2013 16:09     Performed at [facility]   Culture     Final   Value: NO GROWTH 5 DAYS     Performed at [facility]   Report Status 08/17/2013 FINAL   Final  URINE CULTURE     Status: None   Collection Time    08/11/13  3:43 PM      Result Value Ref Range Status   Specimen Description URINE, CLEAN CATCH   Final   Special Requests NONE   Final   Culture  Setup Time     Final   Value: 08/11/2013 22:40     Performed at [facility] Count     Final   Value: >=100,000 COLONIES/ML     Performed at [facility]   Culture     Final   Value: ENTEROCOCCUS SPECIES     Performed at [facility]   Report Status 08/13/2013 FINAL   Final   Organism ID, Bacteria ENTEROCOCCUS SPECIES   Final  MRSA PCR SCREENING     Status: None   Collection Time    08/11/13  5:24 PM      Result Value Ref Range Status   MRSA by PCR NEGATIVE  NEGATIVE Final   Comment:            The GeneXpert MRSA Assay (FDA     approved for NASAL specimens     only), is one component of a     comprehensive MRSA colonization     surveillance program. It is not     intended to diagnose MRSA     infection nor to guide or     monitor treatment for     MRSA infections.     Studies: Dg Chest Port 1 View  08/17/2013   CLINICAL DATA:  New onset of fever.  EXAM: PORTABLE CHEST - 1 VIEW  COMPARISON:  DG CHEST 1V PORT dated 08/14/2013; DG CHEST 1V PORT dated 08/11/2013  FINDINGS: Heart size is deemed to be upper normal given the lung volumes and AP projection. Suspected subsegmental atelectasis in the left lower lobe. Lungs appear otherwise clear. No pleural effusion observed.  IMPRESSION: 1. Subsegmental atelectasis, left lower lobe.  Otherwise negative.   Electronically Signed   By: Herbie Baltimore M.D.   On: 08/17/2013 08:04    Scheduled Meds: . antiseptic oral rinse  15 mL Mouth  Rinse BID  . ciprofloxacin  500 mg Oral BID  . citalopram  40 mg Oral q morning - 10a  . enoxaparin (LOVENOX) injection  60 mg Subcutaneous QHS  . metoprolol succinate  50 mg Oral QHS  . pantoprazole  40 mg Oral Daily  . polyethylene glycol  17 g Oral Daily  . simvastatin  40 mg Oral QHS  . white petrolatum   Topical BID   Continuous Infusions: . sodium chloride 50 mL/hr at 08/17/13 2046    Active Problems:   Severe sepsis   Acute respiratory failure with hypoxia   Enterococcus UTI   Recurrent fever   Acute pyelonephritis   Leukocytosis, unspecified    Time spent: 30 min    Starkisha Tullis, [facility]  [facility] Pager [phone]. If 7PM-7AM, please contact night-coverage at [email or web address], password [facility] 08/18/2013, 1:50 PM  LOS: 7 days

## 2013-08-19 ENCOUNTER — Ambulatory Visit (HOSPITAL_COMMUNITY): Payer: BC Managed Care – PPO

## 2013-08-19 DIAGNOSIS — R509 Fever, unspecified: Secondary | ICD-10-CM

## 2013-08-19 LAB — BASIC METABOLIC PANEL
BUN: 15 mg/dL (ref 6–23)
CALCIUM: 8.6 mg/dL (ref 8.4–10.5)
CO2: 28 meq/L (ref 19–32)
Chloride: 96 mEq/L (ref 96–112)
Creatinine, Ser: 1.44 mg/dL — ABNORMAL HIGH (ref 0.50–1.35)
GFR calc Af Amer: 61 mL/min — ABNORMAL LOW (ref >90)
GFR calc non Af Amer: 53 mL/min — ABNORMAL LOW (ref >90)
GLUCOSE: 113 mg/dL — AB (ref 70–99)
POTASSIUM: 3.8 meq/L (ref 3.7–5.3)
Sodium: 134 mEq/L — ABNORMAL LOW (ref 137–147)

## 2013-08-19 LAB — CBC
HEMATOCRIT: 31.9 % — AB (ref 39.0–52.0)
Hemoglobin: 10.9 g/dL — ABNORMAL LOW (ref 13.0–17.0)
MCH: 29.9 pg (ref 26.0–34.0)
MCHC: 34.2 g/dL (ref 30.0–36.0)
MCV: 87.6 fL (ref 78.0–100.0)
Platelets: 330 10*3/uL (ref 150–400)
RBC: 3.64 MIL/uL — ABNORMAL LOW (ref 4.22–5.81)
RDW: 14.4 % (ref 11.5–15.5)
WBC: 12.7 10*3/uL — ABNORMAL HIGH (ref 4.0–10.5)

## 2013-08-19 MED ORDER — ENSURE COMPLETE PO LIQD
237.0000 mL | Freq: Two times a day (BID) | ORAL | Status: DC
  Administered 2013-08-19: 237 mL via ORAL

## 2013-08-19 MED ORDER — FUROSEMIDE 20 MG PO TABS
20.0000 mg | ORAL_TABLET | Freq: Every day | ORAL | Status: DC
  Administered 2013-08-20: 20 mg via ORAL
  Filled 2013-08-19: qty 1

## 2013-08-19 MED ORDER — AMOXICILLIN-POT CLAVULANATE 875-125 MG PO TABS
1.0000 | ORAL_TABLET | Freq: Two times a day (BID) | ORAL | Status: DC
  Administered 2013-08-19 – 2013-08-20 (×2): 1 via ORAL
  Filled 2013-08-19 (×3): qty 1

## 2013-08-19 MED ORDER — IOHEXOL 300 MG/ML  SOLN
80.0000 mL | Freq: Once | INTRAMUSCULAR | Status: AC | PRN
  Administered 2013-08-19: 80 mL via INTRAVENOUS

## 2013-08-19 MED ORDER — IOHEXOL 300 MG/ML  SOLN
25.0000 mL | INTRAMUSCULAR | Status: AC
  Administered 2013-08-19 (×2): 25 mL via ORAL

## 2013-08-19 NOTE — Progress Notes (Signed)
[facility] PROGRESS NOTE  Edward MowersKevan E [name] WNU:[id number] DOB: 07-Aug-1955 DOA: 08/11/2013 PCP: Loleta DickerJUDGE,ERIN, FNP  Assessment/Plan  SIRS/Sepsis likely secondary to pyelonephritis. UCx growing enterococcus s. To FQ.  Recurrent fevers for the last 2 days, not improving.   -  BCx NGTD  -  Continue ciprofloxacin 2/22  -  Flu PCR neg -  Repeat UCx neg -  Repeat CXR:  Atelectasis -  No diarrhea to suggest C. Diff -  CT abd/pelvis to eval for perinephric abscess:  Neg for explanation of fevers -  ID consult   Headaches, left temporal.  No photophobia or nuchal rigidity.  Description sounds a little like cluster headache but without tearing or runny nose.  Only lasts for a split second, then resolves.  No sinus pressure.  May be rebound headache after stoppin mobic.  resolved  -  Ibuprofen prn HA  Right hydronephrosis  - stable per urology, voiding without difficulty.  -  F/u urology in 6 weeks for repeat renal US  Acute hypoxic respiratory failure, resolved.  Was likely secondary to acute on chronic diastolic heart failure, but cannot exclude acute COPD exac given hx of tobacco abuse.  We extending up - CXR with developing vascular congestion, stopped IVF 2/22  - weight 256 lbs this AM  - restart lasix  Hypertension, Previous hypotension now resolved.  - Continue BB  - Hold diuretic due to recent AKI but if weight trending up, consider starting low dose Lasix   Acute renal failure, creatinine stable.   -  Minimize NSAIDS  Leukocytosis persisting, worrisome for underlying untreated infection  -  Repeat in AM  Right peritracheal prominence  - CT chest with contrast will have to wait due to acute renal failure but can be obtained in an outpatient setting   Constipation -  Continue colace, miralax, lactulose -  Add senna scheduled with prn bisacodyl  Diet:  regular Access:  PIV IVF:  Yes Proph:  lovenox  Code Status: full Family Communication: patient  Disposition Plan:    Trial of Augmentin, monitoring for rashes patient has previously reported allergy.  Consultants:  Urology Procedures/Studies:  Ct Abdomen Pelvis Wo Contrast 08/11/2013 Bilateral renal calculi. Left ureteral stent without left hydronephrosis. Mild right hydronephrosis and hydroureter.  Dg Chest Port 1 View 08/11/2013 Right peritracheal prominence is seen concerning for possible neoplasm or adenopathy Antibiotics:  Vancomycin 2/19 --> 2/22  Maxipime 2/19 -->2/22 Cipro 2/22 >>  HPI/Subjective:  Still feels unwell. Having low-grade fevers almost daily. Sinus congestion is about the same. Denies back pain, abdominal pain, dysuria.  Objective: Filed Vitals:   08/18/13 2055 08/18/13 2206 08/19/13 0459 08/19/13 1351  BP:  135/83 109/67 131/77  Pulse: 83 91 70 79  Temp:  100.6 F (38.1 C) 97.7 F (36.5 C) 98.6 F (37 C)  TempSrc:  Oral Oral Oral  Resp: 18 18 16 18   Height:      Weight:   117.8 kg (259 lb 11.2 oz)   SpO2: 95% 93% 94% 94%   No intake or output data in the 24 hours ending 08/19/13 1812 Filed Weights   08/16/13 0631 08/17/13 0614 08/19/13 0459  Weight: 116.6 kg (257 lb 0.9 oz) 116.8 kg (257 lb 8 oz) 117.8 kg (259 lb 11.2 oz)    Exam:   General:  CM, No acute distress  HEENT:  NCAT, MMM  Cardiovascular:  RRR, nl S1, S2 no mrg, 2+ pulses, warm extremities  Respiratory:  rhonchorous cough, no focal wheezes or  rhonchi, no increased WOB  Abdomen:   NABS, soft, NT/ND, no CVA tenderness  MSK:   Normal tone and bulk, no LEE  Neuro:  Grossly intact  Data Reviewed: Basic Metabolic Panel:  Recent Labs Lab 08/15/13 0505 08/16/13 0525 08/17/13 0545 08/18/13 0520 08/19/13 0515  NA 135* 131* 131* 132* 134*  K 3.6* 3.5* 4.0 4.0 3.8  CL 94* 92* 93* 94* 96  CO2 32 29 28 27 28   GLUCOSE 108* 139* 118* 121* 113*  BUN 14 20 17 18 15   CREATININE 1.32 1.32 1.43* 1.41* 1.44*  CALCIUM 8.9 8.8 8.6 8.6 8.6   Liver Function Tests: No results found for this basename:  AST, ALT, ALKPHOS, BILITOT, PROT, ALBUMIN,  in the last 168 hours No results found for this basename: LIPASE, AMYLASE,  in the last 168 hours No results found for this basename: AMMONIA,  in the last 168 hours CBC:  Recent Labs Lab 08/15/13 0505 08/16/13 0525 08/17/13 0545 08/18/13 0520 08/19/13 0515  WBC 12.2* 10.5 13.2* 15.5* 12.7*  HGB 12.8* 12.3* 12.1* 11.7* 10.9*  HCT 38.0* 36.9* 35.4* 35.0* 31.9*  MCV 88.2 88.1 87.4 87.7 87.6  PLT 192 210 251 304 330   Cardiac Enzymes: No results found for this basename: CKTOTAL, CKMB, CKMBINDEX, TROPONINI,  in the last 168 hours BNP (last 3 results)  Recent Labs  08/14/13 0328  PROBNP 534.8*   CBG: No results found for this basename: GLUCAP,  in the last 168 hours  Recent Results (from the past 240 hour(s))  CULTURE, BLOOD (ROUTINE X 2)     Status: None   Collection Time    08/11/13  1:32 PM      Result Value Ref Range Status   Specimen Description BLOOD RIGHT WRIST   Final   Special Requests BOTTLES DRAWN AEROBIC AND ANAEROBIC 5CC EACH   Final   Culture  Setup Time     Final   Value: 08/11/2013 16:10     Performed at [facility]   Culture     Final   Value: NO GROWTH 5 DAYS     Performed at [facility]   Report Status 08/17/2013 FINAL   Final  CULTURE, BLOOD (ROUTINE X 2)     Status: None   Collection Time    08/11/13  1:37 PM      Result Value Ref Range Status   Specimen Description BLOOD RIGHT HAND   Final   Special Requests BOTTLES DRAWN AEROBIC AND ANAEROBIC [facility] EACH   Final   Culture  Setup Time     Final   Value: 08/11/2013 16:09     Performed at [facility]   Culture     Final   Value: NO GROWTH 5 DAYS     Performed at [facility]   Report Status 08/17/2013 FINAL   Final  URINE CULTURE     Status: None   Collection Time    08/11/13  3:43 PM      Result Value Ref Range Status   Specimen Description URINE, CLEAN CATCH   Final   Special Requests NONE   Final   Culture   Setup Time     Final   Value: 08/11/2013 22:40     Performed at [facility] Count     Final   Value: >=100,000 COLONIES/ML     Performed at [facility]   Culture     Final  Value: ENTEROCOCCUS SPECIES     Performed at [facility]   Report Status 08/13/2013 FINAL   Final   Organism ID, Bacteria ENTEROCOCCUS SPECIES   Final  MRSA PCR SCREENING     Status: None   Collection Time    08/11/13  5:24 PM      Result Value Ref Range Status   MRSA by PCR NEGATIVE  NEGATIVE Final   Comment:            The GeneXpert MRSA Assay (FDA     approved for NASAL specimens     only), is one component of a     comprehensive MRSA colonization     surveillance program. It is not     intended to diagnose MRSA     infection nor to guide or     monitor treatment for     MRSA infections.  URINE CULTURE     Status: None   Collection Time    08/17/13 10:10 AM      Result Value Ref Range Status   Specimen Description URINE, RANDOM   Final   Special Requests NONE   Final   Culture  Setup Time     Final   Value: 08/18/2013 01:23     Performed at [facility]   Colony Count     Final   Value: NO GROWTH     Performed at [facility]   Culture     Final   Value: NO GROWTH     Performed at [facility]   Report Status 08/18/2013 FINAL   Final     Studies: Ct Abdomen Pelvis W Contrast  08/19/2013   CLINICAL DATA:  58 year old with urinary tract infections and persistent fevers.  EXAM: CT ABDOMEN AND PELVIS WITH CONTRAST  TECHNIQUE: Multidetector CT imaging of the abdomen and pelvis was performed using the standard protocol following bolus administration of intravenous contrast.  CONTRAST:  80mL OMNIPAQUE IOHEXOL 300 MG/ML  SOLN  COMPARISON:  08/11/2013 and 06/28/2013  FINDINGS: Stable 4 mm nodule at the right lung base on sequence 5, image 23. There is a stable nodule at the right lung base on sequence 5, image 12 that measures 6 mm. This nodule is  unchanged after re-measuring the nodule from the prior exams. There is also a stable punctate nodule at the left lung base on sequence 5, image 19.  There is no evidence for free intraperitoneal air. There is gas within the right anterior subcutaneous tissues and most likely related to subcutaneous injection sites. Stable appearance of the liver and the gallbladder has been removed. Portal venous system is patent. Normal appearance of the spleen, pancreas and adrenal glands. Again noted are small lymph nodes throughout the upper abdomen and retroperitoneum. Index left para-aortic lymph node on sequence 2, image 45 measures 9 mm in Jalil Lorusso axis and unchanged.  Again noted is bilateral perinephric stranding which has not significantly changed. There is stable dilatation of the right renal pelvis and fullness of the proximal right ureter. There are two punctate right kidney stones but no evidence for a right ureter stone. Urinary bladder is decompressed. The left ureter stent has been removed. Patient continues to have multiple left renal stones. Index stone in left kidney lower pole measures 9 mm and stable. There is slightly increased edema in the left lower quadrant, adjacent to the left iliac vessels and left ureter. This is best seen on sequence 2, image 79. Mild dilatation  of the mid left ureter without a stone. There is slightly increased edema or stranding in the left lower quadrant on sequence 2, image 70.  Small calcifications in the prostate without enlargement. No gross abnormality to the seminal vesicles. No acute abnormality in the small or large bowel. Appendix is not identified.  No acute bone abnormality.  IMPRESSION: Mild edema or stranding in the left lower quadrant of unknown etiology.  There is mild dilatation of the left ureter but no evidence for a ureter stone. The left ureter stent has been removed in the interim. Nonobstructive left renal calculi.  Chronic fullness of the right renal collecting  system without an obstructing stone. Punctate nonobstructive right kidney stones.  Bilateral pulmonary nodules are stable since 06/28/2013. Largest nodule measures 6 mm in the right lower lobe. If the patient is at high risk for bronchogenic carcinoma, follow-up chest CT at 6-12 months is recommended. If the patient is at low risk for bronchogenic carcinoma, follow-up chest CT at 12 months is recommended. This recommendation follows the consensus statement: Guidelines for Management of Small Pulmonary Nodules Detected on CT Scans: A Statement from the Fleischner Society as published in Radiology 2005;237:395-400.   Electronically Signed   By: Richarda Overlie M.D.   On: 08/19/2013 10:35    Scheduled Meds: . amoxicillin-clavulanate  1 tablet Oral Q12H  . antiseptic oral rinse  15 mL Mouth Rinse BID  . citalopram  40 mg Oral q morning - 10a  . enoxaparin (LOVENOX) injection  60 mg Subcutaneous QHS  . feeding supplement (ENSURE COMPLETE)  237 mL Oral BID BM  . metoprolol succinate  50 mg Oral QHS  . pantoprazole  40 mg Oral Daily  . polyethylene glycol  17 g Oral Daily  . senna  2 tablet Oral QHS  . simvastatin  40 mg Oral QHS  . white petrolatum   Topical BID   Continuous Infusions: . sodium chloride 50 mL/hr at 08/19/13 4098    Active Problems:   Severe sepsis   Acute respiratory failure with hypoxia   Enterococcus UTI   Recurrent fever   Acute pyelonephritis   Leukocytosis, unspecified    Time spent: 30 min    Chike Farrington, [facility]  [facility] Pager [phone]. If 7PM-7AM, please contact night-coverage at [email or web address], password [facility] 08/19/2013, 6:12 PM  LOS: 8 days

## 2013-08-19 NOTE — Progress Notes (Signed)
From urologic prospective the patient is doing well.  Repeat urine culture NGTD.  Creatinine is stable.  Would continue Cipro until March 6th.  Patient has urology follow-up on March 26th for a renal ultrasound at 10:30am.  No further urologic recommendations.  Will sign-off.  Please page the on-call urologist with questions or concerns over the weekend.

## 2013-08-19 NOTE — Consult Note (Signed)
[name]  Total days of antibiotics 9        Day 6 cipro        (previously on vanco and cefepime x 4) Reason for Consult:fever    Referring Physician: short  Active Problems:   Severe sepsis   Acute respiratory failure with hypoxia   Enterococcus UTI   Recurrent fever   Acute pyelonephritis   Leukocytosis, unspecified    HPI: Edward [name] is a 58 y.o. male who has extensive nephrolithiasis. Most recently he had bilateral ureteroscopy, right stent removal, left ureteroscopy/laser lithotripsy and left ureteral stent placement on 08/05/13. The patient was discharged home from procedure and reported doing well until 2/17 when started to have flank pain, malaise, fevers. He was admitted on 2/19 where he was found to have wbc of 21K , temp of 101. Ur cx showing amp S enterococcus. Underwent abd pelvis CT showing consistent with bilateral stranding to kidneys, unchanged from prior imaging. He is currently on day 9 of abtx therapy but in the last 2 days has had low grade temp, t max of 100.6 yesterday in addition to mildly elevated WBC. Infectious disease asked to see patient for recommendations on abtx.   He states that his allergy to penicillin is mild rash, "annoying", more than 20 years ago. He thinks he may have tolerated amoxicillin before   Past Medical History  Diagnosis Date  . Hypertension   . Hyperlipidemia   . Fibromyalgia   . GERD (gastroesophageal reflux disease)   . History of kidney stones     LONG HX AND CURRENTLY PASSED STONES ALL THE TIME  . Depression   . Leg pain, bilateral     aching of both legs - chronic  . OSA on CPAP   . History of hepatitis     CHILDHOOD-- UNKNOWN TYPE  . Renal calculus, left     MULTIPLE  . Wears contact lenses     Allergies:  Allergies  Allergen Reactions  . Lyrica [Pregabalin] Swelling  . Penicillins Rash    MEDICATIONS: . antiseptic oral rinse  15 mL Mouth Rinse BID  . ciprofloxacin  500 mg  Oral BID  . citalopram  40 mg Oral q morning - 10a  . enoxaparin (LOVENOX) injection  60 mg Subcutaneous QHS  . metoprolol succinate  50 mg Oral QHS  . pantoprazole  40 mg Oral Daily  . polyethylene glycol  17 g Oral Daily  . senna  2 tablet Oral QHS  . simvastatin  40 mg Oral QHS  . white petrolatum   Topical BID    History  Substance Use Topics  . Smoking status: Former Smoker -- 2.00 packs/day for 40 years    Types: Cigarettes    Quit date: 07/19/2003  . Smokeless tobacco: Never Used  . Alcohol Use: No    History reviewed. No pertinent family history.   Review of Systems  Constitutional: + malaise. Negative for fever, chills, diaphoresis, activity change, appetite change, fatigue and unexpected weight change.  HENT: Negative for congestion, sore throat, rhinorrhea, sneezing, trouble swallowing and sinus pressure.  Eyes: Negative for photophobia and visual disturbance.  Respiratory: Negative for cough, chest tightness, shortness of breath, wheezing and stridor.  Cardiovascular: Negative for chest pain, palpitations and leg swelling.  Gastrointestinal: Negative for nausea, vomiting, abdominal pain, diarrhea, constipation, blood in stool, abdominal distention and anal bleeding.  Genitourinary: Negative for dysuria, hematuria, flank pain and difficulty urinating.  Musculoskeletal: Negative  for myalgias, back pain, joint swelling, arthralgias and gait problem.  Skin: Negative for color change, pallor, rash and wound.  Neurological: Negative for dizziness, tremors, weakness and light-headedness.  Hematological: Negative for adenopathy. Does not bruise/bleed easily.  Psychiatric/Behavioral: Negative for behavioral problems, confusion, sleep disturbance, dysphoric mood, decreased concentration and agitation.     OBJECTIVE: Temp:  [97.7 F (36.5 C)-100.6 F (38.1 C)] 97.7 F (36.5 C) (02/27 0459) Pulse Rate:  [70-91] 70 (02/27 0459) Resp:  [16-20] 16 (02/27 0459) BP:  (109-135)/(67-83) 109/67 mmHg (02/27 0459) SpO2:  [93 %-95 %] 94 % (02/27 0459) Weight:  [259 lb 11.2 oz (117.8 kg)] 259 lb 11.2 oz (117.8 kg) (02/27 0459)  Constitutional: He is oriented to person, place, and time. He appears well-developed and well-nourished. No distress.  HENT:  Mouth/Throat: Oropharynx is clear and moist. No oropharyngeal exudate.  Cardiovascular: Normal rate, regular rhythm and normal heart sounds. Exam reveals no gallop and no friction rub.  No murmur heard.  Pulmonary/Chest: Effort normal and breath sounds normal. No respiratory distress. He has no wheezes.  Abdominal: Soft. Bowel sounds are normal. He exhibits no distension. There is no tenderness.  Lymphadenopathy:  He has no cervical adenopathy.  Neurological: He is alert and oriented to person, place, and time.  Skin: Skin is warm and dry. No rash noted. No erythema.  Psychiatric: He has a normal mood and affect. His behavior is normal.   LABS: Results for orders placed during the hospital encounter of 08/11/13 (from the past 48 hour(s))  INFLUENZA PANEL BY PCR (TYPE A & B, H1N1)     Status: None   Collection Time    08/17/13  4:35 PM      Result Value Ref Range   Influenza A By PCR NEGATIVE  NEGATIVE   Influenza B By PCR NEGATIVE  NEGATIVE   H1N1 flu by pcr NOT DETECTED  NOT DETECTED   Comment:            The Xpert Flu assay (FDA approved for     nasal aspirates or washes and     nasopharyngeal swab specimens), is     intended as an aid in the diagnosis of     influenza and should not be used as     a sole basis for treatment.     Performed at [facility] PANEL     Status: Abnormal   Collection Time    08/18/13  5:20 AM      Result Value Ref Range   Sodium 132 (*) 137 - 147 mEq/L   Potassium 4.0  3.7 - 5.3 mEq/L   Chloride 94 (*) 96 - 112 mEq/L   CO2 27  19 - 32 mEq/L   Glucose, Bld 121 (*) 70 - 99 mg/dL   BUN 18  6 - 23 mg/dL   Creatinine, Ser 1.41 (*) 0.50 - 1.35  mg/dL   Calcium 8.6  8.4 - 10.5 mg/dL   GFR calc non Af Amer 54 (*) >90 mL/min   GFR calc Af Amer 62 (*) >90 mL/min   Comment: (NOTE)     The eGFR has been calculated using the CKD EPI equation.     This calculation has not been validated in all clinical situations.     eGFR's persistently <90 mL/min signify possible Chronic Kidney     Disease.  CBC     Status: Abnormal   Collection Time    08/18/13  5:20 AM  Result Value Ref Range   WBC 15.5 (*) 4.0 - 10.5 K/uL   RBC 3.99 (*) 4.22 - 5.81 MIL/uL   Hemoglobin 11.7 (*) 13.0 - 17.0 g/dL   HCT 35.0 (*) 39.0 - 52.0 %   MCV 87.7  78.0 - 100.0 fL   MCH 29.3  26.0 - 34.0 pg   MCHC 33.4  30.0 - 36.0 g/dL   RDW 14.3  11.5 - 15.5 %   Platelets 304  150 - 400 K/uL  BASIC METABOLIC PANEL     Status: Abnormal   Collection Time    08/19/13  5:15 AM      Result Value Ref Range   Sodium 134 (*) 137 - 147 mEq/L   Potassium 3.8  3.7 - 5.3 mEq/L   Chloride 96  96 - 112 mEq/L   CO2 28  19 - 32 mEq/L   Glucose, Bld 113 (*) 70 - 99 mg/dL   BUN 15  6 - 23 mg/dL   Creatinine, Ser 1.44 (*) 0.50 - 1.35 mg/dL   Calcium 8.6  8.4 - 10.5 mg/dL   GFR calc non Af Amer 53 (*) >90 mL/min   GFR calc Af Amer 61 (*) >90 mL/min   Comment: (NOTE)     The eGFR has been calculated using the CKD EPI equation.     This calculation has not been validated in all clinical situations.     eGFR's persistently <90 mL/min signify possible Chronic Kidney     Disease.  CBC     Status: Abnormal   Collection Time    08/19/13  5:15 AM      Result Value Ref Range   WBC 12.7 (*) 4.0 - 10.5 K/uL   RBC 3.64 (*) 4.22 - 5.81 MIL/uL   Hemoglobin 10.9 (*) 13.0 - 17.0 g/dL   HCT 31.9 (*) 39.0 - 52.0 %   MCV 87.6  78.0 - 100.0 fL   MCH 29.9  26.0 - 34.0 pg   MCHC 34.2  30.0 - 36.0 g/dL   RDW 14.4  11.5 - 15.5 %   Platelets 330  150 - 400 K/uL   MICRO: 2/19 urine cx:  Amp S, nitrofurantoin S, FQ S, tetra R 2/19 blood cx: ngtd   IMAGING: Ct Abdomen Pelvis W  Contrast  08/19/2013   CLINICAL DATA:  58 year old with urinary tract infections and persistent fevers.  EXAM: CT ABDOMEN AND PELVIS WITH CONTRAST  TECHNIQUE: Multidetector CT imaging of the abdomen and pelvis was performed using the standard protocol following bolus administration of intravenous contrast.  CONTRAST:  55m OMNIPAQUE IOHEXOL 300 MG/ML  SOLN  COMPARISON:  08/11/2013 and 06/28/2013  FINDINGS: Stable 4 mm nodule at the right lung base on sequence 5, image 23. There is a stable nodule at the right lung base on sequence 5, image 12 that measures 6 mm. This nodule is unchanged after re-measuring the nodule from the prior exams. There is also a stable punctate nodule at the left lung base on sequence 5, image 19.  There is no evidence for free intraperitoneal air. There is gas within the right anterior subcutaneous tissues and most likely related to subcutaneous injection sites. Stable appearance of the liver and the gallbladder has been removed. Portal venous system is patent. Normal appearance of the spleen, pancreas and adrenal glands. Again noted are small lymph nodes throughout the upper abdomen and retroperitoneum. Index left para-aortic lymph node on sequence 2, image 45 measures 9 mm in short axis  and unchanged.  Again noted is bilateral perinephric stranding which has not significantly changed. There is stable dilatation of the right renal pelvis and fullness of the proximal right ureter. There are two punctate right kidney stones but no evidence for a right ureter stone. Urinary bladder is decompressed. The left ureter stent has been removed. Patient continues to have multiple left renal stones. Index stone in left kidney lower pole measures 9 mm and stable. There is slightly increased edema in the left lower quadrant, adjacent to the left iliac vessels and left ureter. This is best seen on sequence 2, image 79. Mild dilatation of the mid left ureter without a stone. There is slightly increased  edema or stranding in the left lower quadrant on sequence 2, image 70.  Small calcifications in the prostate without enlargement. No gross abnormality to the seminal vesicles. No acute abnormality in the small or large bowel. Appendix is not identified.  No acute bone abnormality.  IMPRESSION: Mild edema or stranding in the left lower quadrant of unknown etiology.  There is mild dilatation of the left ureter but no evidence for a ureter stone. The left ureter stent has been removed in the interim. Nonobstructive left renal calculi.  Chronic fullness of the right renal collecting system without an obstructing stone. Punctate nonobstructive right kidney stones.  Bilateral pulmonary nodules are stable since 06/28/2013. Largest nodule measures 6 mm in the right lower lobe. If the patient is at high risk for bronchogenic carcinoma, follow-up chest CT at 6-12 months is recommended. If the patient is at low risk for bronchogenic carcinoma, follow-up chest CT at 12 months is recommended. This recommendation follows the consensus statement: Guidelines for Management of Small Pulmonary Nodules Detected on CT Scans: A Statement from the [place] as published in Radiology 2005;237:395-400.   Electronically Signed   By: Markus Daft M.D.   On: 08/19/2013 10:35    Assessment/Plan:  58yo M with hx of extensive nephrolithiasis s/p ureteral stent placement and lithotripsy presents with pyelonephritis roughly 1wk after urologic procedure thought to be due to enterococcal uti. Currently on day 6 of cipro but having low grade fevers. Repeat CT does not show significant worsening to explain fevers over the last 2 days.  - will get differential on cbc and blood cultures - can switch to amoxicillin/clav 868m BID until mar 6th to finish 14 day course - watch to see if any rash from amox/clav  Enoch Moffa B. [facility] [phone]

## 2013-08-19 NOTE — Progress Notes (Signed)
INITIAL NUTRITION ASSESSMENT  DOCUMENTATION CODES Per approved criteria  -Obesity Unspecified   INTERVENTION: - Ensure Complete BID - Encouraged increased meal intake and healthy meals/beverages discussed  - will continue to monitor   NUTRITION DIAGNOSIS: Predicted suboptimal energy intake related to chronic poor appetite as evidenced by pt report.   Goal: Pt to consume >90% of meals/supplements  Monitor:  Weights, labs, intake  Reason for Assessment: Malnutrition screening tool   58 y.o. male  Admitting Dx: Generalized weakness, fever   ASSESSMENT: Pt with hx of HTN, HLD, fibromyalgia, GERD, hx of kidney stones, depression, bilateral leg pain, OSA, and hepatitis who presents to the ED with main concern of sudden onset of fever of 101 - 102 F, lethargy, poor oral intake. Wife at bedside explains that pt had right ureteral stent removal and left laser lithotripsy with stone extraction done 08/05/2013 by Dr. Louis Meckel. He went home and was initially doing well but 2 days piror to this admission he started to feel tired, has not been eating or drinking fluids, lightheaded and weak.   Met with pt and wife who report pt hasn't had an appetite for a very long time. Pt states he eats 1-2 meals/day at home of things like grits, rice/mac and cheese/potatoes, and a meat. Drinks 3 liters of root beer/day. Wife states "I've been married to him for over 20 years and I've been trying to get him to eat healthy but he won't change". Pt reports he has no sense of taste so he eats a lot of really salty or sweet foods. Pt c/o having some diarrhea today which he reports is the 3rd time it's happened since admission. Pt ate >75% of breakfast this morning which included cereal and bacon. Agreeable to trying Ensure.   Height: Ht Readings from Last 1 Encounters:  08/11/13 6' 1"  (1.854 m)    Weight: Wt Readings from Last 1 Encounters:  08/19/13 259 lb 11.2 oz (117.8 kg)    Ideal Body Weight: 184 lb    % Ideal Body Weight: 141%  Wt Readings from Last 10 Encounters:  08/19/13 259 lb 11.2 oz (117.8 kg)  08/05/13 264 lb 4.8 oz (119.886 kg)  08/05/13 264 lb 4.8 oz (119.886 kg)  07/23/13 267 lb 10.2 oz (121.4 kg)  07/23/13 267 lb 10.2 oz (121.4 kg)  07/18/13 277 lb (125.646 kg)    Usual Body Weight: 267 lb last month  % Usual Body Weight: 97%  BMI:  Body mass index is 34.27 kg/(m^2). Class I obesity  Estimated Nutritional Needs: Kcal: 2050-2250 Protein: 100-120g Fluid: 2-2.2L/day  Skin: Non-pitting RUE, LUE edema, perineum incision   Diet Order: General  EDUCATION NEEDS: -No education needs identified at this time   Intake/Output Summary (Last 24 hours) at 08/19/13 1206 Last data filed at 08/18/13 1500  Gross per 24 hour  Intake    360 ml  Output    700 ml  Net   -340 ml    Last BM: 2/24  Labs:   Recent Labs Lab 08/17/13 0545 08/18/13 0520 08/19/13 0515  NA 131* 132* 134*  K 4.0 4.0 3.8  CL 93* 94* 96  CO2 28 27 28   BUN 17 18 15   CREATININE 1.43* 1.41* 1.44*  CALCIUM 8.6 8.6 8.6  GLUCOSE 118* 121* 113*    CBG (last 3)  No results found for this basename: GLUCAP,  in the last 72 hours  Scheduled Meds: . antiseptic oral rinse  15 mL Mouth Rinse BID  .  ciprofloxacin  500 mg Oral BID  . citalopram  40 mg Oral q morning - 10a  . enoxaparin (LOVENOX) injection  60 mg Subcutaneous QHS  . metoprolol succinate  50 mg Oral QHS  . pantoprazole  40 mg Oral Daily  . polyethylene glycol  17 g Oral Daily  . senna  2 tablet Oral QHS  . simvastatin  40 mg Oral QHS  . white petrolatum   Topical BID    Continuous Infusions: . sodium chloride 50 mL/hr at 08/19/13 8016    Past Medical History  Diagnosis Date  . Hypertension   . Hyperlipidemia   . Fibromyalgia   . GERD (gastroesophageal reflux disease)   . History of kidney stones     LONG HX AND CURRENTLY PASSED STONES ALL THE TIME  . Depression   . Leg pain, bilateral     aching of both legs -  chronic  . OSA on CPAP   . History of hepatitis     CHILDHOOD-- UNKNOWN TYPE  . Renal calculus, left     MULTIPLE  . Wears contact lenses     Past Surgical History  Procedure Laterality Date  . Cystoscopy with retrograde pyelogram, ureteroscopy and stent placement Bilateral 07/22/2013    Procedure: CYSTOSCOPY WITH LEFT AND RIGHT  URETEROSCOPY ,HOLMIUM LASER LITHTRIPSY,  STENT PLACEMENT, RIGHT AND LEFT  RETROGRAM PYELOGRAM ;  Surgeon: Ardis Hughs, MD;  Location: [facility];  Service: Urology;  Laterality: Bilateral;  . Nephrolithotomy Right 07/22/2013    Procedure: RIGHT PERCUTANEOUS NEPHROLITHOTOMY SURGEON ACCESS ;  Surgeon: Ardis Hughs, MD;  Location: [facility];  Service: Urology;  Laterality: Right;  . Holmium laser application Bilateral 5/53/7482    Procedure: HOLMIUM LASER APPLICATION;  Surgeon: Ardis Hughs, MD;  Location: [facility];  Service: Urology;  Laterality: Bilateral;  . Right ureteroscopic stone extraction  02-17-2000  . Percutaneous nephrostolithotomy Right 06-02-2008  . Extracorporeal shock wave lithotripsy  MULTIPLE  . Multiple cysto/ ureteoscopic stone extractions  PRIOR TO 2001  . Appendectomy  1970's  . Cholecystectomy  2001  . Cystoscopy with retrograde pyelogram, ureteroscopy and stent placement Bilateral 08/05/2013    Procedure: BILATERAL URETEROSCOPY WITH BILATERAL RETROGRADE PYELOGRAM, RIGHT STENT REMOVAL, LEFT LASER LITHOTRIPSY  AND LEFT URETEDRAL STENT EXCHANGE;  Surgeon: Ardis Hughs, MD;  Location: [facility];  Service: Urology;  Laterality: Bilateral;  . Holmium laser application Left 12/27/8673    Procedure: HOLMIUM LASER APPLICATION;  Surgeon: Ardis Hughs, MD;  Location: [facility];  Service: Urology;  Laterality: Left;    Mikey College MS, [place], [place] Pager [phone] After Hours Pager

## 2013-08-20 DIAGNOSIS — G4485 Primary stabbing headache: Secondary | ICD-10-CM

## 2013-08-20 LAB — DIFFERENTIAL
Basophils Absolute: 0.1 10*3/uL (ref 0.0–0.1)
Basophils Relative: 1 % (ref 0–1)
Eosinophils Absolute: 0.3 10*3/uL (ref 0.0–0.7)
Eosinophils Relative: 2 % (ref 0–5)
Lymphocytes Relative: 14 % (ref 12–46)
Lymphs Abs: 2.1 10*3/uL (ref 0.7–4.0)
Monocytes Absolute: 1.6 10*3/uL — ABNORMAL HIGH (ref 0.1–1.0)
Monocytes Relative: 10 % (ref 3–12)
NEUTROS ABS: 11.4 10*3/uL — AB (ref 1.7–7.7)
NEUTROS PCT: 74 % (ref 43–77)

## 2013-08-20 LAB — BASIC METABOLIC PANEL
BUN: 13 mg/dL (ref 6–23)
CO2: 26 mEq/L (ref 19–32)
Calcium: 8.8 mg/dL (ref 8.4–10.5)
Chloride: 96 mEq/L (ref 96–112)
Creatinine, Ser: 1.29 mg/dL (ref 0.50–1.35)
GFR, EST AFRICAN AMERICAN: 70 mL/min — AB (ref >90)
GFR, EST NON AFRICAN AMERICAN: 60 mL/min — AB (ref >90)
Glucose, Bld: 117 mg/dL — ABNORMAL HIGH (ref 70–99)
Potassium: 4 mEq/L (ref 3.7–5.3)
Sodium: 132 mEq/L — ABNORMAL LOW (ref 137–147)

## 2013-08-20 LAB — CBC
HCT: 35 % — ABNORMAL LOW (ref 39.0–52.0)
HEMOGLOBIN: 11.9 g/dL — AB (ref 13.0–17.0)
MCH: 30.2 pg (ref 26.0–34.0)
MCHC: 34 g/dL (ref 30.0–36.0)
MCV: 88.8 fL (ref 78.0–100.0)
Platelets: 394 10*3/uL (ref 150–400)
RBC: 3.94 MIL/uL — ABNORMAL LOW (ref 4.22–5.81)
RDW: 14.5 % (ref 11.5–15.5)
WBC: 15.5 10*3/uL — AB (ref 4.0–10.5)

## 2013-08-20 MED ORDER — LACTULOSE 10 GM/15ML PO SOLN: 20.0000 g | Freq: Two times a day (BID) | ORAL | Status: AC

## 2013-08-20 MED ORDER — AMOXICILLIN-POT CLAVULANATE 875-125 MG PO TABS: 1.0000 | ORAL_TABLET | Freq: Two times a day (BID) | ORAL | Status: AC

## 2013-08-20 NOTE — Discharge Summary (Signed)
Physician Discharge Summary  Edward [name] ZOX:[id number] DOB: August 04, 1955 DOA: 08/11/2013  PCP: Loleta Dicker, FNP  Admit date: 08/11/2013 Discharge date: 08/20/2013  Recommendations for Outpatient Follow-up:  1. Follow up with primary care doctor in 2-3 days.  CBC and BMP to check WBC, sodium, and creatinine.  Please check temperature and screen for signs of allergy to PCN.  2. Urology appointment scheduled for March 26th at 10:30AM for renal US 3. Repeat chest CT in about 4 months.    Discharge Diagnoses:  Principal Problem:   Severe sepsis Active Problems:   Acute respiratory failure with hypoxia   Enterococcus UTI   Recurrent fever   Acute pyelonephritis   Leukocytosis, unspecified   Primary stabbing headache   Discharge Condition: stable, improved  Diet recommendation: healthy heart  Wt Readings from Last 3 Encounters:  08/20/13 118.3 kg (260 lb 12.9 oz)  08/05/13 119.886 kg (264 lb 4.8 oz)  08/05/13 119.886 kg (264 lb 4.8 oz)    History of present illness:  58 y.o. male with a PMH of HTN, HLD, fibromyalgia, GERD, hx of kidney stones, depression, bilateral leg pain, OSA, and hepatitis who presents to the ED with main concern of sudden onset of fever of 101 - 102 F, lethargy, poor oral intake. Pt is unable to provide detailed history as he is rather somnolent. Wife at bedside explains that pt had right ureteral stent removal and left laser lithotripsy with stone extraction done 08/05/2013 by Dr. Marlou Porch. He went home and was initially doing well but 2 days piror to this admission he started to feel tired, has not been eating or drinking fluids, lightheaded and weak. This has been associated with worsening and constant lower back pain, throbbing and 10/10 in severity, non radiating, no specific alleviating or aggravating factors. Pt was seen in Dr. Jasmine Awe clinic, found to be hypotensive and tachycardic, he was sent to ED for further evaluation and [facility] asked to admit.  Hospital  Course:   SIRS/Sepsis possibly secondary to pyelonephritis.  His initial urinalysis demonstrated mod LE, 21-50 WBC and many bacteria and urine culture grew enterococcus.  Abdomen and pelvis CT was negative for abscess, but there was also no comment on edema or fat stranding around the kidneys.  BCx NGTD.  CXR repeatedly negative for pneumonia.  He was initially started on vancomycin and cefepime, however, he was transitioned to ciprofloxacin once urine culture was reported (had stated he had PCN allergy).  He developed recurrent low grade fevers, however.  Repeat CXR was negative.  Repeat BCx are no growth to date.  Repeat UA was negative.  ID was consulted and upon further review, recommended a trial of augmentin as ciprofloxacin is not the most effective treatment for Enterococcus.  He also underwent repeat CT ab/p which was negative for abscess.  His temperature has trended down, however, he has had a persistently mildly elevated WBC, currently 15K at discharge.  Advised to continue checking his temperature at home and he should follow up with his primary care doctor soon after discharge for repeat examination, temperature check, and repeat CBC to continue to trend WBC.  If he has persistent fevers and leukocytosis, consider further evaluation for malignancy/autoimmune disorders.  Flu PCR was negative.    Primary stabbing headache, left temporal. No photophobia or nuchal rigidity and no tearing or runny nose. Only lasts for a split second, then resolves. No sinus pressure. May have been triggered by stopping mobic due to AKI.   Right hydronephrosis stable per urology,  voiding without difficulty although having some urgency of urination.  F/u urology in 6 weeks for repeat renal US.  Acute hypoxic respiratory failure, resolved. Was likely secondary to acute on chronic diastolic heart failure and CXR demonstrated vascular congestion.  His IVF (given for AKI) were discontinued.  He was given lasix.     Hypertension, Previous hypotension now resolved. Beta blocker was restarted and he may also restart his diuretic.    Acute renal failure, creatinine peaked at 2 and trended down to 1.29 at the time of discharge.  Recommend minimizing NSAIDS.  Avoid ARBS.    Leukocytosis likely due to stress for urinary procedures and UTI.  Repeat as outpatient.    Right peritracheal prominence. Seen on initial CXR but not on subsequent films.  Already being followed for pulmonary nodules, getting CT chest q6 months in [place].  Continue routine screening.    Constipation resolved with colace, miralax, lactulose   Consultants:  Urology Procedures/Studies:  Ct Abdomen Pelvis Wo Contrast 08/11/2013 Bilateral renal calculi. Left ureteral stent without left hydronephrosis. Mild right hydronephrosis and hydroureter.  Dg Chest Port 1 View 08/11/2013 Right peritracheal prominence is seen concerning for possible neoplasm or adenopathy Antibiotics:  Vancomycin 2/19 --> 2/22  Maxipime 2/19 -->2/22  Cipro 2/22 >> 2/27 Augmentin 2/27 >> March 2/6   Discharge Exam: Filed Vitals:   08/20/13 0500  BP: 115/63  Pulse: 77  Temp: 98.1 F (36.7 C)  Resp: 18   Filed Vitals:   08/19/13 1351 08/19/13 2102 08/19/13 2300 08/20/13 0500  BP: 131/77  118/70 115/63  Pulse: 79 82 83 77  Temp: 98.6 F (37 C)  98 F (36.7 C) 98.1 F (36.7 C)  TempSrc: Oral  Oral Oral  Resp: 18 17 18 18   Height:      Weight:    118.3 kg (260 lb 12.9 oz)  SpO2: 94% 95% 94% 94%    General: CM, No acute distress, stable from prior HEENT: NCAT, MMM  Cardiovascular: RRR, nl S1, S2 no mrg, 2+ pulses, warm extremities  Respiratory: rhonchorous cough, no focal wheezes or rhonchi, no increased WOB  Abdomen: NABS, soft, NT/ND, no CVA tenderness  MSK: Normal tone and bulk, no LEE  Neuro: Grossly intact   Discharge Instructions      Discharge Orders   Future Orders Complete By Expires   Call MD for:  difficulty breathing,  headache or visual disturbances  As directed    Call MD for:  extreme fatigue  As directed    Call MD for:  hives  As directed    Call MD for:  persistant dizziness or light-headedness  As directed    Call MD for:  persistant nausea and vomiting  As directed    Call MD for:  severe uncontrolled pain  As directed    Call MD for:  temperature >100.4  As directed    Diet - low sodium heart healthy  As directed    Discharge instructions  As directed    Comments:     You were hospitalized with pyelonephritis, or kidney infection.  You should continue antibiotics through March 6th, then stop.  Please follow up with your primary care doctor in 2 to 3 days for repeat evaluation and bloodwork.  If you develop high fevers, chills, or signs of worsening infection, please return to the hospital.  For constipation, try taking lactulose once or twice a day every day.  Continue using over the counter stool softener so you have 1-2  soft bowel movements per day.  Continue talking to your doctor about your sleeping problems and make sure your cpap machine is always titrated correctly.   Increase activity slowly  As directed        Medication List    STOP taking these medications       ciprofloxacin 500 MG tablet  Commonly known as:  CIPRO     meloxicam 15 MG tablet  Commonly known as:  MOBIC      TAKE these medications       amitriptyline 25 MG tablet  Commonly known as:  ELAVIL  Take 50-75 mg by mouth at bedtime as needed for sleep.     amoxicillin-clavulanate 875-125 MG per tablet  Commonly known as:  AUGMENTIN  Take 1 tablet by mouth every 12 (twelve) hours.     anti-nausea solution  Take 10 mLs by mouth every 15 (fifteen) minutes as needed for nausea or vomiting.     bismuth subsalicylate 262 MG/15ML suspension  Commonly known as:  PEPTO BISMOL  Take 30 mLs by mouth every 6 (six) hours as needed for diarrhea or loose stools (nausea).     cetirizine 10 MG tablet  Commonly known as:   ZYRTEC  Take 10 mg by mouth every morning.     citalopram 40 MG tablet  Commonly known as:  CELEXA  Take 40 mg by mouth every morning.     cyanocobalamin 1000 MCG/ML injection  Commonly known as:  (VITAMIN B-12)  Inject 1,000 mcg into the muscle every 30 (thirty) days.     docusate sodium 100 MG capsule  Commonly known as:  COLACE  Take 1 capsule (100 mg total) by mouth 2 (two) times daily as needed (take to keep stool soft.).     hydrochlorothiazide 12.5 MG tablet  Commonly known as:  HYDRODIURIL  Take 12.5 mg by mouth every morning.     KLS SLEEP AID 25 MG tablet  Generic drug:  doxylamine (Sleep)  Take 25 mg by mouth at bedtime as needed for sleep.     lactulose 10 GM/15ML solution  Commonly known as:  CHRONULAC  Take 30 mLs (20 g total) by mouth 2 (two) times daily.     Melatonin 3 MG Tabs  Take 3 mg by mouth at bedtime.     metoprolol succinate 25 MG 24 hr tablet  Commonly known as:  TOPROL-XL  Take 50 mg by mouth at bedtime.     MIRALAX PO  Take 17 g by mouth daily as needed (constipation).     multivitamin with minerals Tabs tablet  Take 1 tablet by mouth every morning.     omeprazole 20 MG capsule  Commonly known as:  PRILOSEC  Take 20 mg by mouth every morning.     oxyCODONE 15 MG immediate release tablet  Commonly known as:  ROXICODONE  Take 1 tablet (15 mg total) by mouth every 3 (three) hours as needed for pain.     simvastatin 40 MG tablet  Commonly known as:  ZOCOR  Take 40 mg by mouth at bedtime.     testosterone cypionate 200 MG/ML injection  Commonly known as:  DEPOTESTOTERONE CYPIONATE  Inject 200 mg into the muscle every 14 (fourteen) days.       Follow-up Information   Follow up with JUDGE,ERIN, FNP In 3 days.   Specialty:  Family Medicine   Contact information:   [address] [place], [place] [address] [place] [address] [phone]  Follow up with Crist Fat, MD On 09/15/2013. (10:30AM)    Specialty:  Urology   Contact  information:   [address] [facility]  [place] [address] [phone]        The results of significant diagnostics from this hospitalization (including imaging, microbiology, ancillary and laboratory) are listed below for reference.    Significant Diagnostic Studies: Ct Abdomen Pelvis Wo Contrast  08/11/2013   CLINICAL DATA:  Abdominal pain, fever, weakness, hypotension, history hypertension, kidney stones, fibromyalgia, hepatitis  EXAM: CT ABDOMEN AND PELVIS WITHOUT CONTRAST  TECHNIQUE: Multidetector CT imaging of the abdomen and pelvis was performed following the standard protocol without intravenous contrast. No oral contrast administered. Sagittal and coronal MPR images reconstructed from axial data set.  COMPARISON:  06/28/2013  FINDINGS: Bibasilar atelectasis.  Left ureteral stent extends from left renal pelvis into urinary bladder.  Bilateral nonobstructing renal calculi larger on left, largest 8 mm diameter.  Mild right hydronephrosis and ureteral dilatation.  Minimal nonspecific stranding of perinephric fat planes without discrete renal mass.  Post cholecystectomy.  Question mildly nodular hepatic margins cannot exclude cirrhosis.  Within limits of a nonenhanced exam no additional abnormalities of the liver, spleen, pancreas, or adrenal glands.  Scattered normal size gastrohepatic ligament, aortocaval, left periaortic and periportal lymph nodes.  Scattered atherosclerotic calcifications.  Stomach and bowel loops unremarkable.  No mass, adenopathy, free fluid or inflammatory process.  No acute osseous findings.  IMPRESSION: Bilateral renal calculi.  Left ureteral stent without left hydronephrosis.  Mild right hydronephrosis and hydroureter.  Question minimally nodular hepatic margins, cannot exclude cirrhosis.   Electronically Signed   By: Ulyses Southward M.D.   On: 08/11/2013 14:48   Dg Abd 1 View  07/22/2013   CLINICAL DATA:  Left kidney stones  EXAM: ABDOMEN  - 1 VIEW  COMPARISON:  CT abdomen and pelvis 06/28/2013  FINDINGS: Two digital C-arm fluoroscopic images obtained intraoperatively are evaluated.  Images demonstrate the a left ureteral stent extending from the left renal pelvis into the anatomic pelvis.  Left renal collecting system does not appear dilated.  Proximal left ureteral calculus seen on the preceding CT exam is not definitely visualized on the current study.  A small amount of contrast opacifies the mid portion of left ureter, which appears mildly dilated.  IMPRESSION: The previously identified left ureteral calculus is not radiographically evident on the obtained intraoperative C-arm images.  Mild dilatation of the middle portion of the left ureter.   Electronically Signed   By: Ulyses Southward M.D.   On: 07/22/2013 18:04   Ct Abdomen Pelvis W Contrast  08/19/2013   CLINICAL DATA:  58 year old with urinary tract infections and persistent fevers.  EXAM: CT ABDOMEN AND PELVIS WITH CONTRAST  TECHNIQUE: Multidetector CT imaging of the abdomen and pelvis was performed using the standard protocol following bolus administration of intravenous contrast.  CONTRAST:  80mL OMNIPAQUE IOHEXOL 300 MG/ML  SOLN  COMPARISON:  08/11/2013 and 06/28/2013  FINDINGS: Stable 4 mm nodule at the right lung base on sequence 5, image 23. There is a stable nodule at the right lung base on sequence 5, image 12 that measures 6 mm. This nodule is unchanged after re-measuring the nodule from the prior exams. There is also a stable punctate nodule at the left lung base on sequence 5, image 19.  There is no evidence for free intraperitoneal air. There is gas within the right anterior subcutaneous tissues and most likely related to subcutaneous injection  sites. Stable appearance of the liver and the gallbladder has been removed. Portal venous system is patent. Normal appearance of the spleen, pancreas and adrenal glands. Again noted are small lymph nodes throughout the upper abdomen and  retroperitoneum. Index left para-aortic lymph node on sequence 2, image 45 measures 9 mm in Ehab Humber axis and unchanged.  Again noted is bilateral perinephric stranding which has not significantly changed. There is stable dilatation of the right renal pelvis and fullness of the proximal right ureter. There are two punctate right kidney stones but no evidence for a right ureter stone. Urinary bladder is decompressed. The left ureter stent has been removed. Patient continues to have multiple left renal stones. Index stone in left kidney lower pole measures 9 mm and stable. There is slightly increased edema in the left lower quadrant, adjacent to the left iliac vessels and left ureter. This is best seen on sequence 2, image 79. Mild dilatation of the mid left ureter without a stone. There is slightly increased edema or stranding in the left lower quadrant on sequence 2, image 70.  Small calcifications in the prostate without enlargement. No gross abnormality to the seminal vesicles. No acute abnormality in the small or large bowel. Appendix is not identified.  No acute bone abnormality.  IMPRESSION: Mild edema or stranding in the left lower quadrant of unknown etiology.  There is mild dilatation of the left ureter but no evidence for a ureter stone. The left ureter stent has been removed in the interim. Nonobstructive left renal calculi.  Chronic fullness of the right renal collecting system without an obstructing stone. Punctate nonobstructive right kidney stones.  Bilateral pulmonary nodules are stable since 06/28/2013. Largest nodule measures 6 mm in the right lower lobe. If the patient is at high risk for bronchogenic carcinoma, follow-up chest CT at 6-12 months is recommended. If the patient is at low risk for bronchogenic carcinoma, follow-up chest CT at 12 months is recommended. This recommendation follows the consensus statement: Guidelines for Management of Small Pulmonary Nodules Detected on CT Scans: A  Statement from the Fleischner Society as published in Radiology 2005;237:395-400.   Electronically Signed   By: Richarda Overlie M.D.   On: 08/19/2013 10:35   Ir Perc Nephrostomy Right  07/22/2013   INDICATION: Ordering urologist, Dr. Marlou Porch has been attempting to acquire percutaneous access to the right kidney for percutaneous nephrolithotomy and has requested assistance as this has proven difficult.  EXAM: 1. FLUOROSCOPIC GUIDANCE FOR PUNCTURE OF THE RIGHT RENAL COLLECTING SYSTEM. 2. RIGHT PERCUTANEOUS NEPHROSTOMY TUBE PLACEMENT.  MEDICATIONS: Patient currently under general anesthesia  ANESTHESIA/SEDATION: Please refer to operative dictation  CONTRAST:  Please refer to operative dictation  COMPARISON:  CT of the abdomen and pelvis - 06/28/2013  FLUOROSCOPY TIME:  Please refer to operative dictation  PROCEDURE: The patient had been previously sterilely prepped in the operating room and was currently under general anesthesia.  Initial attempts have been made to access the dilated right renal collecting system, however this had proven unsuccessful.  I initially attempted to utilize the access needle though it became clear the access needle was not definitively within the right renal collecting system.  As such, a dysmorphic and blunted posterior inferior renal calyx was targeted with a 22 gauge Chiba needle. Access to the collecting system was confirmed with advancement of a Nitrex wire into the collecting system. The needle was exchanged for an Accustick set was utilized to dilate the tract and was subsequently exchanged for a Kumpe catheter  over an Amplatz wire. The Kumpe catheter was advanced down the ureter and into the urinary bladder. Postprocedural spot radiographs were obtained. The urologist and then proceeded with percutaneous nephrolithotomy.  COMPLICATIONS: None immediate  FINDINGS: The right-sided renal collecting system is markedly diet radiating as demonstrated from intraoperative retrograde pyelogram.   Successful percutaneous access to the right renal collecting system via a blunted and dysmorphic appearing posterior inferior calyx with placement of a Kumpe the catheter to the level of the urinary bladder.  IMPRESSION: Successful fluoroscopic guided placement of a right sided 5 [place] Kumpe catheter to the level of the urinary bladder to be utilized during impending nephrolithotomy procedure.   Electronically Signed   By: Simonne Come M.D.   On: 07/22/2013 16:43   Dg Chest Port 1 View  08/17/2013   CLINICAL DATA:  New onset of fever.  EXAM: PORTABLE CHEST - 1 VIEW  COMPARISON:  DG CHEST 1V PORT dated 08/14/2013; DG CHEST 1V PORT dated 08/11/2013  FINDINGS: Heart size is deemed to be upper normal given the lung volumes and AP projection. Suspected subsegmental atelectasis in the left lower lobe. Lungs appear otherwise clear. No pleural effusion observed.  IMPRESSION: 1. Subsegmental atelectasis, left lower lobe.  Otherwise negative.   Electronically Signed   By: Herbie Baltimore M.D.   On: 08/17/2013 08:04   Dg Chest Port 1 View  08/14/2013   CLINICAL DATA:  Shortness of breath and abdominal pain  EXAM: PORTABLE CHEST - 1 VIEW  COMPARISON:  08/11/2013  FINDINGS: Moderate cardiac enlargement is noted. There is pulmonary vascular congestion. No pleural effusion or edema. No airspace consolidation identified.  IMPRESSION: Cardiac enlargement and pulmonary vascular congestion   Electronically Signed   By: Signa Kell M.D.   On: 08/14/2013 11:01   Dg Chest Port 1 View  08/11/2013   CLINICAL DATA:  Fever, tachycardia.  EXAM: PORTABLE CHEST - 1 VIEW  COMPARISON:  June 04, 2010.  FINDINGS: Mild cardiomegaly is noted. Right paratracheal prominence is now visualized and increased compared to prior exam. No pneumothorax or pleural effusion is noted. No acute pulmonary disease is noted. Bony thorax is intact  IMPRESSION: Right peritracheal prominence is seen concerning for possible neoplasm or adenopathy; CT scan  of the chest with contrast administration is recommended.   Electronically Signed   By: Roque Lias M.D.   On: 08/11/2013 14:33   Dg C-arm 61-120 Min-no Report  07/22/2013   CLINICAL DATA: intra op cysto   C-ARM 61-120 MINUTES  Fluoroscopy was utilized by the requesting physician.  No radiographic  interpretation.    Dg C-arm Gt 120 Min-no Report  07/22/2013   CLINICAL DATA: intra op percutaneous nephrolithotomy   C-ARM GT 120 MINUTE  Fluoroscopy was utilized by the requesting physician.  No radiographic  interpretation.     Microbiology: Recent Results (from the past 240 hour(s))  CULTURE, BLOOD (ROUTINE X 2)     Status: None   Collection Time    08/11/13  1:32 PM      Result Value Ref Range Status   Specimen Description BLOOD RIGHT WRIST   Final   Special Requests BOTTLES DRAWN AEROBIC AND ANAEROBIC [facility] EACH   Final   Culture  Setup Time     Final   Value: 08/11/2013 16:10     Performed at [facility]   Culture     Final   Value: NO GROWTH 5 DAYS     Performed at [facility]  Report Status 08/17/2013 FINAL   Final  CULTURE, BLOOD (ROUTINE X 2)     Status: None   Collection Time    08/11/13  1:37 PM      Result Value Ref Range Status   Specimen Description BLOOD RIGHT HAND   Final   Special Requests BOTTLES DRAWN AEROBIC AND ANAEROBIC [facility] EACH   Final   Culture  Setup Time     Final   Value: 08/11/2013 16:09     [facility]   Culture     Final   Value: NO GROWTH 5 DAYS     [facility]   Report Status 08/17/2013 FINAL   Final  URINE CULTURE     Status: None   Collection Time    08/11/13  3:43 PM      Result Value Ref Range Status   Specimen Description URINE, CLEAN CATCH   Final   Special Requests NONE   Final   Culture  Setup Time     Final   Value: 08/11/2013 22:40     [facility]formed [facility]   Colony Count     Final   Value: >=100,000 COLONIES/ML     [facility]formed at [facility]    Culture     Final   Value: ENTEROCOCCUS SPECIES     [facility]formed at [facility]   Report Status 08/13/2013 FINAL   Final   Organism ID, Bacteria ENTEROCOCCUS SPECIES   Final  MRSA PCR SCREENING     Status: None   Collection Time    08/11/13  5:24 PM      Result Value Ref Range Status   MRSA by PCR NEGATIVE  NEGATIVE Final   Comment:            The GeneXpert MRSA Assay (FDA     approved for NASAL specimens     only), is one component of a     comprehensive MRSA colonization     surveillance program. It is not     intended to diagnose MRSA     infection nor to guide or     monitor treatment for     MRSA infections.  URINE CULTURE     Status: None   Collection Time    08/17/13 10:10 AM      Result Value Ref Range Status   Specimen Description URINE, RANDOM   Final   Special Requests NONE   Final   Culture  Setup Time     Final   Value: 08/18/2013 01:23     [facility]formed at [facility]   Colony Count     Final   Value: NO GROWTH     [facility]formed at [facility]   Culture     Final   Value: NO GROWTH     Performed at [facility]   Report Status 08/18/2013 FINAL   Final     Labs: Basic Metabolic Panel:  Recent Labs Lab 08/16/13 0525 08/17/13 0545 08/18/13 0520 08/19/13 0515 08/20/13 0556  NA 131* 131* 132* 134* 132*  K 3.5* 4.0 4.0 3.8 4.0  CL 92* 93* 94* 96 96  CO2 29 28 27 28 26   GLUCOSE 139* 118* 121* 113* 117*  BUN 20 17 18 15 13   CREATININE 1.32 1.43* 1.41* 1.44* 1.29  CALCIUM 8.8 8.6 8.6 8.6 8.8   Liver Function Tests: No results found for this basename: AST, ALT, ALKPHOS,  BILITOT, PROT, ALBUMIN,  in the last 168 hours No results found for this basename: LIPASE, AMYLASE,  in the last 168 hours No results found for this basename: AMMONIA,  in the last 168 hours CBC:  Recent Labs Lab 08/16/13 0525 08/17/13 0545 08/18/13 0520 08/19/13 0515 08/20/13 0556  WBC 10.5 13.2* 15.5* 12.7* 15.5*  NEUTROABS  --   --   --   --  11.4*   HGB 12.3* 12.1* 11.7* 10.9* 11.9*  HCT 36.9* 35.4* 35.0* 31.9* 35.0*  MCV 88.1 87.4 87.7 87.6 88.8  PLT 210 251 304 330 394   Cardiac Enzymes: No results found for this basename: CKTOTAL, CKMB, CKMBINDEX, TROPONINI,  in the last 168 hours BNP: BNP (last 3 results)  Recent Labs  08/14/13 0328  PROBNP 534.8*   CBG: No results found for this basename: GLUCAP,  in the last 168 hours  Time coordinating discharge: 45 minutes  Signed:  Yanelis Osika  [facility] 08/20/2013, 11:50 AM

## 2013-08-20 NOTE — Progress Notes (Signed)
Has slept most of evening. Did c/o pain.  up to bathroom  without assistance.

## 2013-08-20 NOTE — Progress Notes (Signed)
Correction. Pt did c/o  in all over tonight.  Gave medication as ordered with good results.  Pt fell asleep with no difficulties. Up walking to bathroom without assistance

## 2013-08-22 NOTE — Progress Notes (Signed)
Discharge summary sent to payer through [facility]  

## 2013-08-25 LAB — CULTURE, BLOOD (ROUTINE X 2)
Culture: NO GROWTH
Culture: NO GROWTH

## 2014-05-12 IMAGING — CT CT ABD-PELV W/O CM
1 series · 15 of 32 positions shown, 19 images · non-contrast
Comparison: 06/28/2013

CLINICAL DATA: Abdominal pain, fever, weakness, hypotension,
history hypertension, kidney stones, fibromyalgia, hepatitis

EXAM:
CT ABDOMEN AND PELVIS WITHOUT CONTRAST
TECHNIQUE: Multidetector CT imaging of the abdomen and pelvis was performed
following the standard protocol without intravenous contrast. No
oral contrast administered. Sagittal and coronal MPR images
reconstructed from axial data set.

[Series 4: lung · axial · 0.90mm/px · z∈[-152,+2]mm · 15 of 36 slices shown, 19 images]
[im 3/36  soft-tissue]
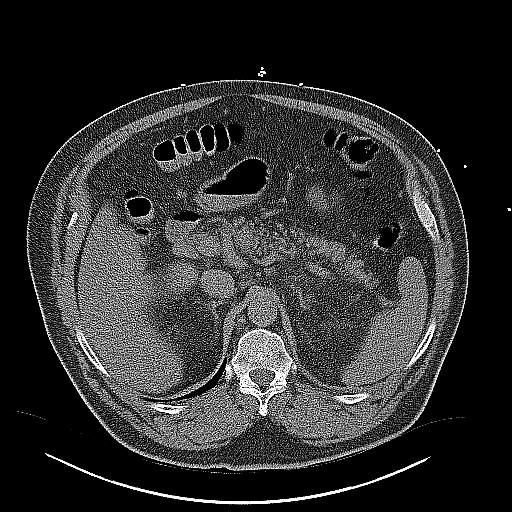
[im 3/36  bone]
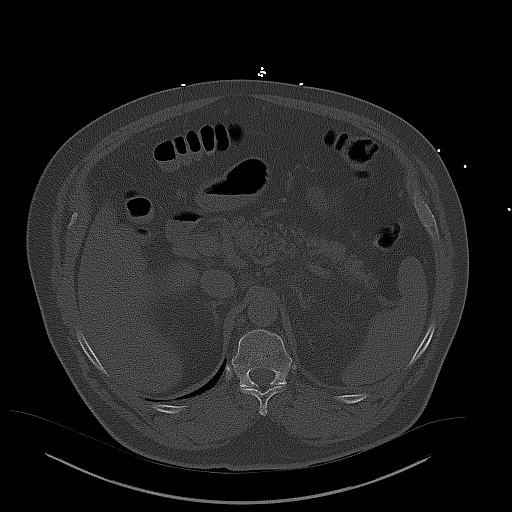
[im 5/36  soft-tissue]
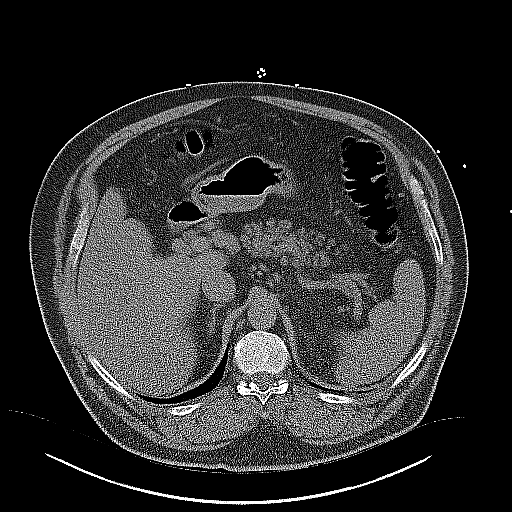
[im 7/36  soft-tissue]
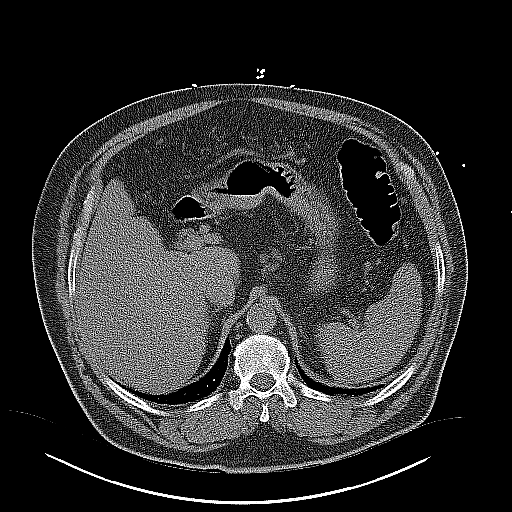
[im 11/36  soft-tissue]
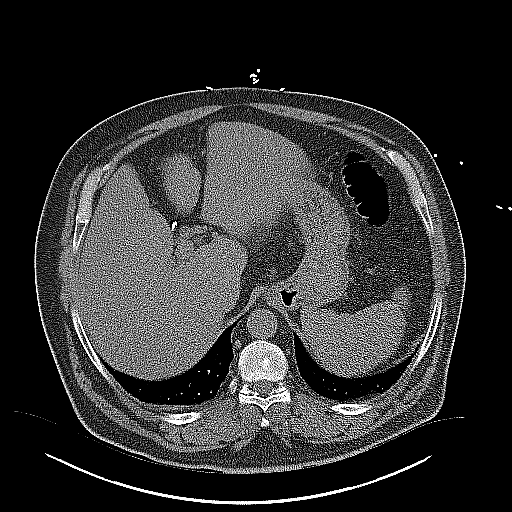
[im 13/36  soft-tissue]
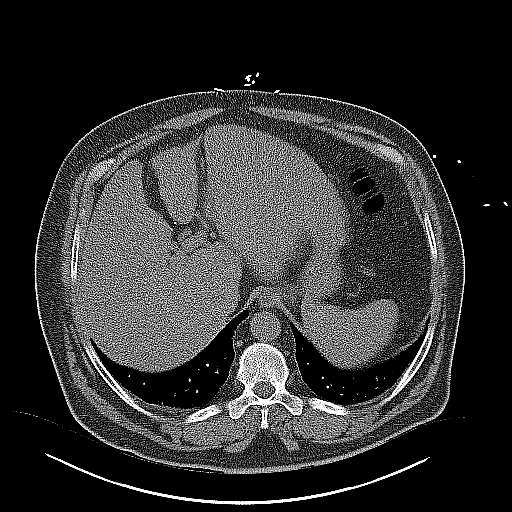
[im 15/36  soft-tissue]
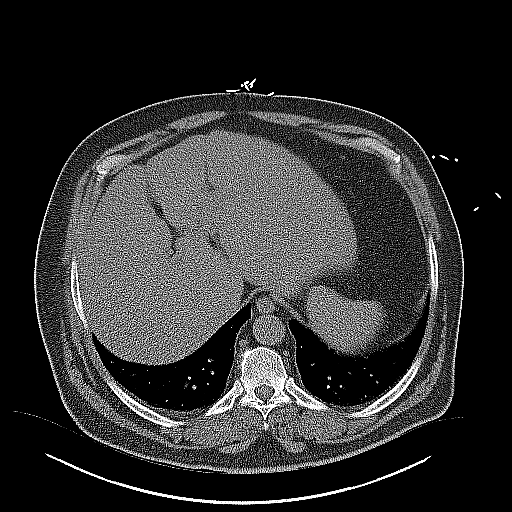
[im 19/36  soft-tissue]
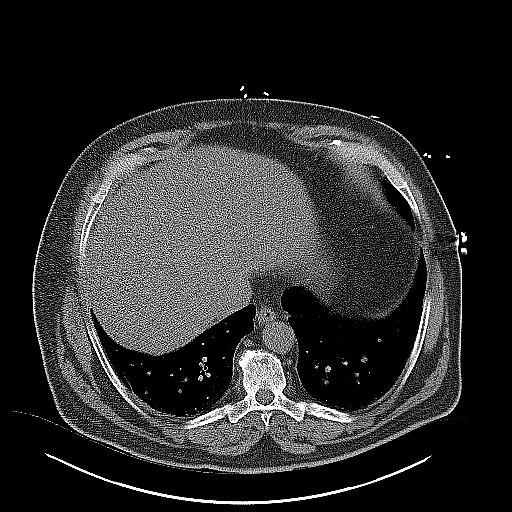
[im 21/36  soft-tissue]
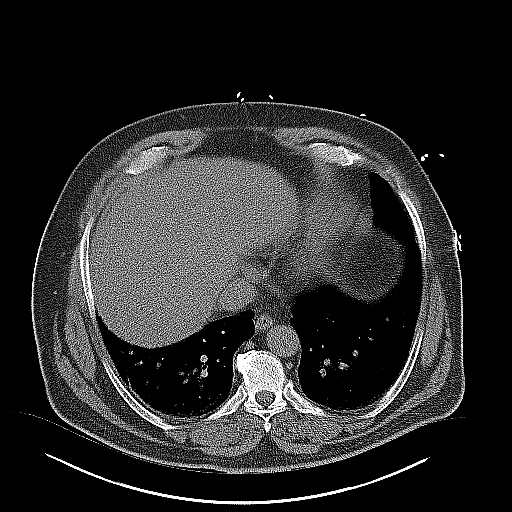
[im 23/36  soft-tissue]
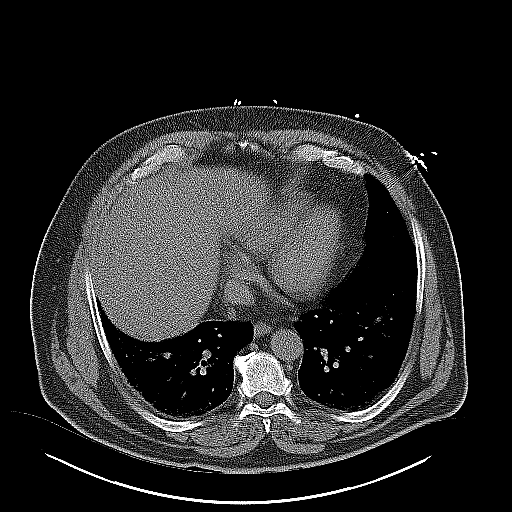
[im 23/36  bone]
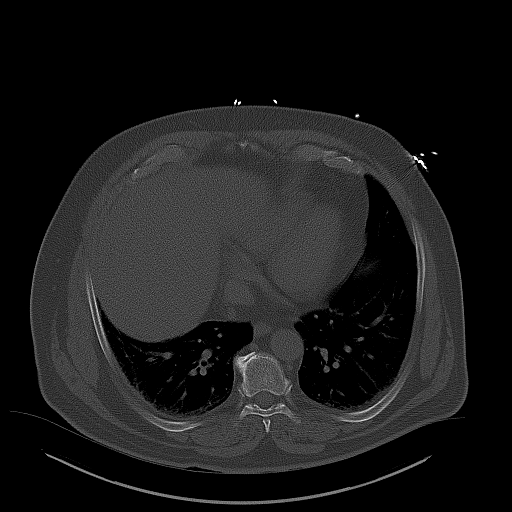
[im 25/36  soft-tissue]
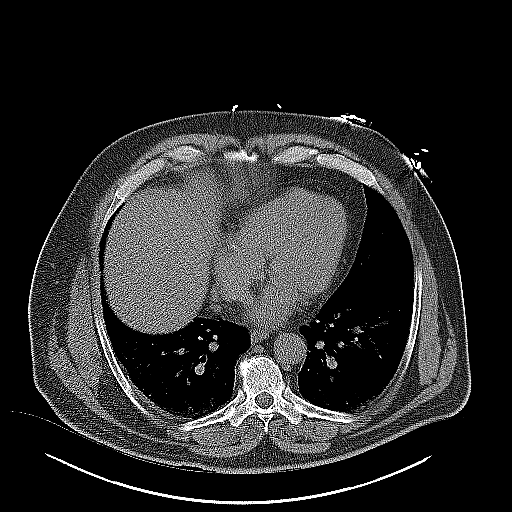
[im 29/36  soft-tissue]
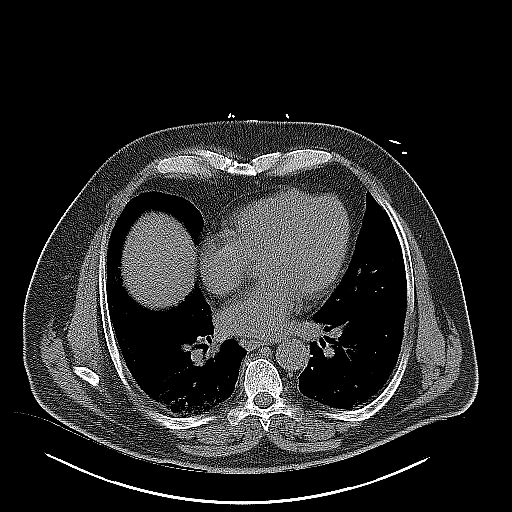
[im 31/36  soft-tissue]
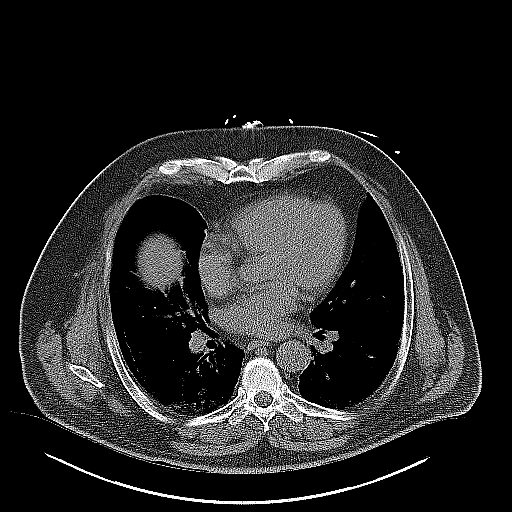
[im 31/36  lung]
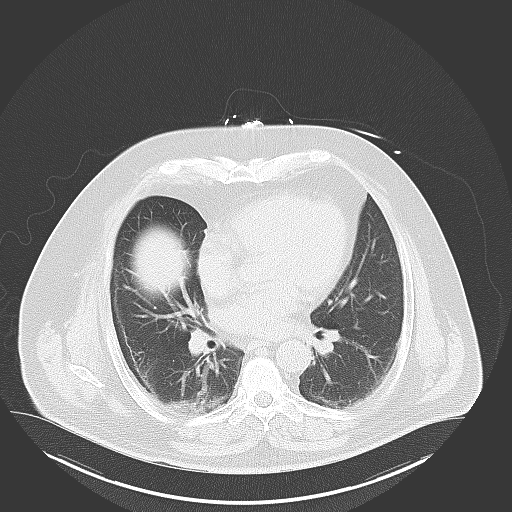
[im 32/36  lung]
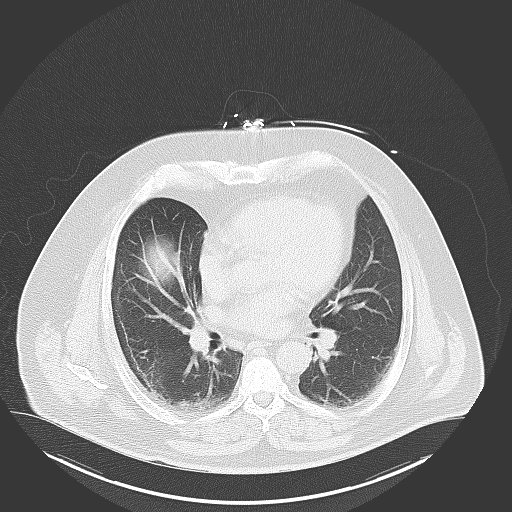
[im 33/36  soft-tissue]
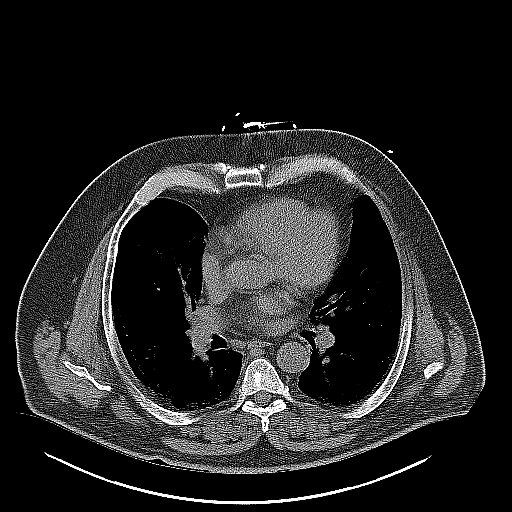
[im 33/36  lung]
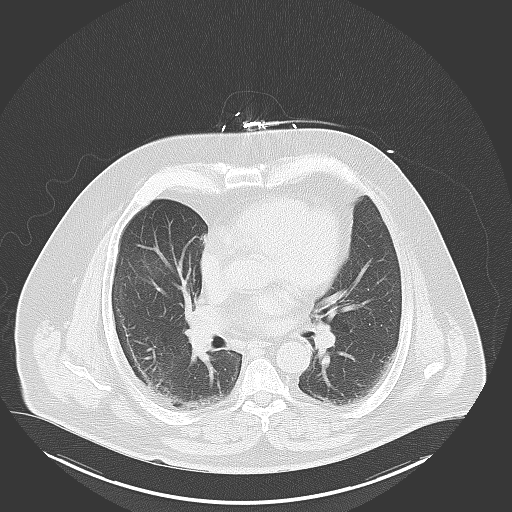
[im 34/36  lung]
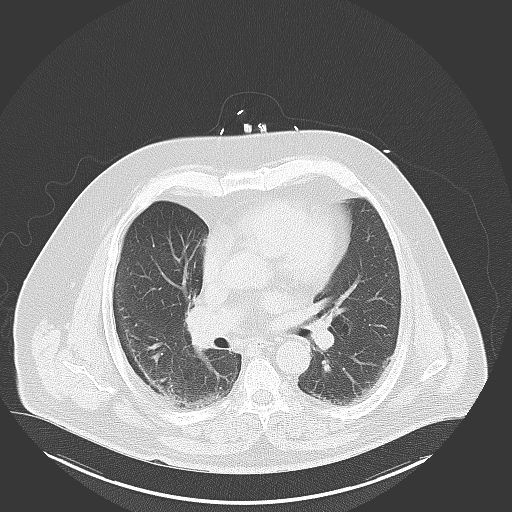

[15 of 32 positions shown; findings below may reference images not displayed]

FINDINGS: Bibasilar atelectasis.

Left ureteral stent extends from left renal pelvis into urinary
bladder.

Bilateral nonobstructing renal calculi larger on left, largest 8 mm
diameter.

Mild right hydronephrosis and ureteral dilatation.

Minimal nonspecific stranding of perinephric fat planes without
discrete renal mass.

Post cholecystectomy.

Question mildly nodular hepatic margins cannot exclude cirrhosis.

Within limits of a nonenhanced exam no additional abnormalities of
the liver, spleen, pancreas, or adrenal glands.

Scattered normal size gastrohepatic ligament, aortocaval, left
periaortic and periportal lymph nodes.

Scattered atherosclerotic calcifications.

Stomach and bowel loops unremarkable.

No mass, adenopathy, free fluid or inflammatory process.

No acute osseous findings.
IMPRESSION: Bilateral renal calculi.

Left ureteral stent without left hydronephrosis.

Mild right hydronephrosis and hydroureter.

Question minimally nodular hepatic margins, cannot exclude
cirrhosis.
